# Patient Record
Sex: Female | Born: 1970
Health system: Southern US, Community
[De-identification: ages and names within clinical notes are randomized; demographics above are authoritative.]

## PROBLEM LIST (undated history)

## (undated) DIAGNOSIS — G894 Chronic pain syndrome: Secondary | ICD-10-CM

## (undated) DIAGNOSIS — R413 Other amnesia: Principal | ICD-10-CM

## (undated) DIAGNOSIS — M5136 Other intervertebral disc degeneration, lumbar region: Secondary | ICD-10-CM

## (undated) DIAGNOSIS — E559 Vitamin D deficiency, unspecified: Secondary | ICD-10-CM

## (undated) DIAGNOSIS — M1990 Unspecified osteoarthritis, unspecified site: Secondary | ICD-10-CM

## (undated) DIAGNOSIS — F329 Major depressive disorder, single episode, unspecified: Secondary | ICD-10-CM

## (undated) DIAGNOSIS — F32A Depression, unspecified: Secondary | ICD-10-CM

## (undated) DIAGNOSIS — R51 Headache: Secondary | ICD-10-CM

## (undated) DIAGNOSIS — M797 Fibromyalgia: Secondary | ICD-10-CM

## (undated) DIAGNOSIS — D219 Benign neoplasm of connective and other soft tissue, unspecified: Secondary | ICD-10-CM

## (undated) DIAGNOSIS — I10 Essential (primary) hypertension: Secondary | ICD-10-CM

## (undated) DIAGNOSIS — M489 Spondylopathy, unspecified: Secondary | ICD-10-CM

## (undated) DIAGNOSIS — E669 Obesity, unspecified: Secondary | ICD-10-CM

## (undated) DIAGNOSIS — E785 Hyperlipidemia, unspecified: Secondary | ICD-10-CM

## (undated) DIAGNOSIS — M51369 Other intervertebral disc degeneration, lumbar region without mention of lumbar back pain or lower extremity pain: Secondary | ICD-10-CM

## (undated) DIAGNOSIS — G629 Polyneuropathy, unspecified: Secondary | ICD-10-CM

## (undated) DIAGNOSIS — H3552 Pigmentary retinal dystrophy: Secondary | ICD-10-CM

## (undated) DIAGNOSIS — IMO0001 Reserved for inherently not codable concepts without codable children: Principal | ICD-10-CM

## (undated) HISTORY — DX: Reserved for inherently not codable concepts without codable children: IMO0001

## (undated) HISTORY — DX: Headache: R51

## (undated) HISTORY — DX: Chronic pain syndrome: G89.4

## (undated) HISTORY — DX: Depression, unspecified: F32.A

## (undated) HISTORY — DX: Other amnesia: R41.3

## (undated) HISTORY — DX: Hyperlipidemia, unspecified: E78.5

## (undated) HISTORY — DX: Vitamin D deficiency, unspecified: E55.9

## (undated) HISTORY — DX: Major depressive disorder, single episode, unspecified: F32.9

## (undated) HISTORY — PX: TUBAL LIGATION: SHX77

## (undated) HISTORY — DX: Obesity, unspecified: E66.9

---

## 2004-05-06 ENCOUNTER — Encounter: Admission: RE | Admit: 2004-05-06 | Discharge: 2004-05-06 | Payer: Self-pay | Admitting: *Deleted

## 2007-04-21 ENCOUNTER — Emergency Department (HOSPITAL_COMMUNITY): Admission: EM | Admit: 2007-04-21 | Discharge: 2007-04-21 | Payer: Self-pay | Admitting: Family Medicine

## 2007-04-22 ENCOUNTER — Emergency Department (HOSPITAL_COMMUNITY): Admission: EM | Admit: 2007-04-22 | Discharge: 2007-04-22 | Payer: Self-pay | Admitting: Emergency Medicine

## 2008-12-17 ENCOUNTER — Emergency Department (HOSPITAL_COMMUNITY): Admission: EM | Admit: 2008-12-17 | Discharge: 2008-12-17 | Payer: Self-pay | Admitting: Emergency Medicine

## 2009-08-12 ENCOUNTER — Emergency Department (HOSPITAL_COMMUNITY): Admission: EM | Admit: 2009-08-12 | Discharge: 2009-08-12 | Payer: Self-pay | Admitting: Emergency Medicine

## 2011-05-16 ENCOUNTER — Other Ambulatory Visit: Payer: Self-pay | Admitting: Physician Assistant

## 2011-05-16 DIAGNOSIS — Z1231 Encounter for screening mammogram for malignant neoplasm of breast: Secondary | ICD-10-CM

## 2011-06-06 ENCOUNTER — Ambulatory Visit: Payer: Self-pay

## 2011-06-11 ENCOUNTER — Ambulatory Visit: Payer: Self-pay

## 2012-10-28 DIAGNOSIS — M489 Spondylopathy, unspecified: Secondary | ICD-10-CM

## 2012-10-28 HISTORY — DX: Spondylopathy, unspecified: M48.9

## 2012-11-01 ENCOUNTER — Encounter (HOSPITAL_COMMUNITY): Payer: Self-pay | Admitting: *Deleted

## 2012-11-01 ENCOUNTER — Ambulatory Visit
Admission: RE | Admit: 2012-11-01 | Discharge: 2012-11-01 | Disposition: A | Discharge status: Home / Self Care | Payer: Medicaid Other | Source: Ambulatory Visit | Attending: Physician Assistant | Admitting: Physician Assistant

## 2012-11-01 ENCOUNTER — Emergency Department (HOSPITAL_COMMUNITY)
Admission: EM | Admit: 2012-11-01 | Discharge: 2012-11-01 | Disposition: A | Discharge status: Home / Self Care | Payer: Medicaid Other | Attending: Emergency Medicine | Admitting: Emergency Medicine

## 2012-11-01 ENCOUNTER — Other Ambulatory Visit: Payer: Self-pay | Admitting: Physician Assistant

## 2012-11-01 ENCOUNTER — Emergency Department (HOSPITAL_COMMUNITY): Payer: Medicaid Other

## 2012-11-01 DIAGNOSIS — M79609 Pain in unspecified limb: Secondary | ICD-10-CM

## 2012-11-01 DIAGNOSIS — M542 Cervicalgia: Secondary | ICD-10-CM

## 2012-11-01 DIAGNOSIS — M545 Low back pain, unspecified: Secondary | ICD-10-CM

## 2012-11-01 DIAGNOSIS — Z87891 Personal history of nicotine dependence: Secondary | ICD-10-CM | POA: Insufficient documentation

## 2012-11-01 DIAGNOSIS — Z8669 Personal history of other diseases of the nervous system and sense organs: Secondary | ICD-10-CM | POA: Insufficient documentation

## 2012-11-01 DIAGNOSIS — M1990 Unspecified osteoarthritis, unspecified site: Secondary | ICD-10-CM | POA: Insufficient documentation

## 2012-11-01 DIAGNOSIS — Z79899 Other long term (current) drug therapy: Secondary | ICD-10-CM | POA: Insufficient documentation

## 2012-11-01 DIAGNOSIS — M79604 Pain in right leg: Secondary | ICD-10-CM

## 2012-11-01 DIAGNOSIS — I10 Essential (primary) hypertension: Secondary | ICD-10-CM | POA: Insufficient documentation

## 2012-11-01 HISTORY — DX: Unspecified osteoarthritis, unspecified site: M19.90

## 2012-11-01 HISTORY — DX: Polyneuropathy, unspecified: G62.9

## 2012-11-01 HISTORY — DX: Essential (primary) hypertension: I10

## 2012-11-01 NOTE — ED Provider Notes (Signed)
Medical screening examination/treatment/procedure(s) were performed by non-physician practitioner and as supervising physician I was immediately available for consultation/collaboration. Devoria Albe, MD, Armando Gang   Ward Givens, MD 11/01/12 [phone]

## 2012-11-01 NOTE — Progress Notes (Signed)
VASCULAR LAB PRELIMINARY  PRELIMINARY  PRELIMINARY  PRELIMINARY  Right lower extremity venous duplex completed.    Preliminary report:  Right:  No evidence of DVT, superficial thrombosis, or Baker's cyst.  Donna Coleman, RVT 11/01/2012, 3:36 PM

## 2012-11-01 NOTE — ED Provider Notes (Signed)
History     CSN: [id number]  Arrival date & time 11/01/12  1144   First MD Initiated Contact with Patient 11/01/12 1333      Chief Complaint  Patient presents with  . Leg Pain    (Consider location/radiation/quality/duration/timing/severity/associated sxs/prior treatment) HPI  42 year old female with history of arthritis and neuropathy presents for evaluations of left leg pain and right leg discomfort.  Patient reports she injured her left midshin a year ago when she ran into the edge of the table and suffered a laceration to the affected area.  Since then she has been having intermittent pain to her left mid shin.  Pain has been worsening for the past several days.  Describe as sharp, stabbing, persistent.  She denies any recent injury but request to have an xray to make sure it's not broken.  No associated numbness or weakness to Coleman leg.  Pt also notice tenderness to medial aspect of R thigh for the past 3-4 days.  Sts she felt a "knot" which is tender to the touch.  Report having cramping sensation to both legs and also shooting pain in both legs.  Denies prior PE/DVT, recent surgery, prolonged bed rest, long trip, calf pain or leg swelling.  Denies recent trauma.  Pt has been taking tramadol, ibuprofen, and neurontin without relief.  Denies cp, sob, fever, chills or rash.   Past Medical History  Diagnosis Date  . Arthritis     neck, central lumbar area  . Neuropathy   . Hypertension     History reviewed. No pertinent past surgical history.  No family history on file.  History  Substance Use Topics  . Smoking status: Former Games developer  . Smokeless tobacco: Not on file  . Alcohol Use: No    OB History   Grav Para Term Preterm Abortions TAB SAB Ect Mult Living                  Review of Systems  Constitutional:       10 Systems reviewed and all are negative for acute change except as noted in the HPI.     Allergies  Review of patient's allergies indicates no known  allergies.  Home Medications   Current Outpatient Rx  Name  Route  Sig  Dispense  Refill  . cycloSPORINE (RESTASIS) 0.05 % ophthalmic emulsion   Both Eyes   Place 1 drop into both eyes 2 (two) times daily.         . DULoxetine (CYMBALTA) 30 MG capsule   Oral   Take 30 mg by mouth daily.         Marland Kitchen gabapentin (NEURONTIN) 300 MG capsule   Oral   Take 300 mg by mouth 2 (two) times daily.         Marland Kitchen ibuprofen (ADVIL,MOTRIN) 800 MG tablet   Oral   Take 800 mg by mouth every 8 (eight) hours as needed for pain.         . indomethacin (INDOCIN) 50 MG capsule   Oral   Take 50 mg by mouth 2 (two) times daily with a meal.         . lidocaine (LIDODERM) 5 %   Transdermal   Place 1-3 patches onto the skin daily. Remove & Discard patch within 12 hours or as directed by MD         . Karma Lew (ZYLET OP)   Ophthalmic   Apply 1 drop to eye 4 (four) times daily.         Marland Kitchen  traMADol (ULTRAM) 50 MG tablet   Oral   Take 50 mg by mouth every 6 (six) hours as needed for pain.           BP 149/103  Pulse 79  Temp(Src) 98.6 F (37 C) (Oral)  Resp 18  SpO2 96%  Physical Exam  Nursing note and vitals reviewed. Constitutional: She appears well-developed and well-nourished. No distress.  Awake, alert, nontoxic appearance  HENT:  Head: Atraumatic.  Eyes: Conjunctivae are normal. Right eye exhibits no discharge. Left eye exhibits no discharge.  Neck: Neck supple.  Cardiovascular: Normal rate, regular rhythm and intact distal pulses.   Pulmonary/Chest: Effort normal. No respiratory distress. She exhibits no tenderness.  Abdominal: Soft. There is no tenderness. There is no rebound.  Musculoskeletal: She exhibits tenderness (R thigh: palpable cords noted to medial thigh, ttp, no overlying skin changes or rash.  No pedal edema.  Coleman leg: tenderness to mid anterior tibia overlying old scar.  no deformity, no rash.). She exhibits no edema.  ROM appears intact, no obvious  focal weakness  Neurological: She is alert.  Mental status and motor strength appears intact  Skin: No rash noted.  Psychiatric: She has a normal mood and affect.    ED Course  Procedures (including critical care time)  2:26 PM Pt presents with complaint of "knot" to R thigh.  There is a palpable cord noted to R medial thigh.  Will obtain venous doppler to r/o DVT.  Pt also has tenderness to Coleman mid shin.  No deformity noted however pt request for xray, xray ordered.    3:57 PM Left lower leg xray shows no acute finding. Doppler study of right leg shows no evidence of DVT. Patient was reassured. I recommend patient to followup with her primary care Dr., Dr. Mayford Knife for further management. Patient able to ambulate without difficulty. She is afebrile with stable normal vital sign. Return precautions discussed.  Labs Reviewed - No data to display Dg Cervical Spine Complete  11/01/2012  *RADIOLOGY REPORT*  Clinical Data: Neck and bilateral leg pain.  CERVICAL SPINE - COMPLETE 4+ VIEW  Comparison: None  Findings: Moderate degenerative cervical spondylosis with multilevel disc disease and facet disease.  Overall alignment is maintained.  No acute bony findings or abnormal prevertebral soft tissue swelling.  The oblique films demonstrate normally aligned facets and patent neural foramen.  The C1-2 articulations are maintained.  The lung apices are clear.  IMPRESSION:  1.  Degenerative cervical spondylosis, somewhat advanced for age. 2.  Normal alignment and no acute bony findings.   Original Report Authenticated By: Rudie Meyer, M.D.    Dg Lumbar Spine Complete  11/01/2012  *RADIOLOGY REPORT*  Clinical Data: Low back and bilateral leg pain.  LUMBAR SPINE - COMPLETE 4+ VIEW  Comparison: None  Findings: Minimal degenerative changes involving the lumbar spine. Normal alignment of the lumbar vertebral bodies and disc spaces are maintained.  No acute bony findings or destructive bony changes. Aorto-iliac  calcifications are noted and are advanced for age.  IMPRESSION:  1.  Mild degenerative changes but no acute bony findings. 2.  Aorto-iliac calcifications.   Original Report Authenticated By: Rudie Meyer, M.D.      No results found for this or any previous visit. Dg Cervical Spine Complete  11/01/2012  *RADIOLOGY REPORT*  Clinical Data: Neck and bilateral leg pain.  CERVICAL SPINE - COMPLETE 4+ VIEW  Comparison: None  Findings: Moderate degenerative cervical spondylosis with multilevel disc disease and facet disease.  Overall alignment is maintained.  No acute bony findings or abnormal prevertebral soft tissue swelling.  The oblique films demonstrate normally aligned facets and patent neural foramen.  The C1-2 articulations are maintained.  The lung apices are clear.  IMPRESSION:  1.  Degenerative cervical spondylosis, somewhat advanced for age. 2.  Normal alignment and no acute bony findings.   Original Report Authenticated By: Rudie Meyer, M.D.    Dg Lumbar Spine Complete  11/01/2012  *RADIOLOGY REPORT*  Clinical Data: Low back and bilateral leg pain.  LUMBAR SPINE - COMPLETE 4+ VIEW  Comparison: None  Findings: Minimal degenerative changes involving the lumbar spine. Normal alignment of the lumbar vertebral bodies and disc spaces are maintained.  No acute bony findings or destructive bony changes. Aorto-iliac calcifications are noted and are advanced for age.  IMPRESSION:  1.  Mild degenerative changes but no acute bony findings. 2.  Aorto-iliac calcifications.   Original Report Authenticated By: Rudie Meyer, M.D.    Dg Tibia/fibula Left  11/01/2012  *RADIOLOGY REPORT*  Clinical Data: Old sore at mid shaft anterior lower leg, continued pain  LEFT TIBIA AND FIBULA - 2 VIEW  Comparison: None  Findings: Diffuse osseous demineralization. Knee and ankle joint alignments normal. No acute fracture, dislocation or bone destruction. Nonfused accessory ossification center at inferior pole of patella.   IMPRESSION: No acute osseous abnormalities.   Original Report Authenticated By: Ulyses Southward, M.D.     Donna Coleman, Donna Coleman Female Jan 19, 1971 [id number]            Progress Notes signed by Gara Kroner, RVT at 11/01/2012 3:38 PM    Author: Gara Kroner, RVT Service: Vascular Lab Author Type: Cardiovascular Sonographer   Filed: 11/01/2012 3:38 PM Note Time: 11/01/2012 3:36 PM         VASCULAR LAB  PRELIMINARY PRELIMINARY PRELIMINARY PRELIMINARY  Right lower extremity venous duplex completed.  Preliminary report: Right: No evidence of DVT, superficial thrombosis, or Baker's cyst.  CESTONE, HELENE, RVT  11/01/2012, 3:36 PM     1. Leg pain, bilateral       MDM  BP 149/103  Pulse 79  Temp(Src) 98.6 F (37 C) (Oral)  Resp 18  SpO2 96%  LMP 10/17/2012  I have reviewed nursing notes and vital signs. I personally reviewed the imaging tests through PACS system  I reviewed available ER/hospitalization records thought the EMR         Donna Helper, PA-C 11/01/12 1600

## 2012-11-01 NOTE — ED Notes (Signed)
Pt had previous left leg mid shin wound that healed about one year ago and since has been having cramping in leg and a shooting pain in the wound area.  Strong pulse

## 2012-11-24 ENCOUNTER — Encounter (HOSPITAL_COMMUNITY): Payer: Self-pay | Admitting: Emergency Medicine

## 2012-11-24 ENCOUNTER — Emergency Department (HOSPITAL_COMMUNITY)
Admission: EM | Admit: 2012-11-24 | Discharge: 2012-11-24 | Disposition: A | Discharge status: Home / Self Care | Payer: Medicaid Other | Attending: Emergency Medicine | Admitting: Emergency Medicine

## 2012-11-24 DIAGNOSIS — Z79899 Other long term (current) drug therapy: Secondary | ICD-10-CM | POA: Insufficient documentation

## 2012-11-24 DIAGNOSIS — G8929 Other chronic pain: Secondary | ICD-10-CM | POA: Insufficient documentation

## 2012-11-24 DIAGNOSIS — Z8739 Personal history of other diseases of the musculoskeletal system and connective tissue: Secondary | ICD-10-CM | POA: Insufficient documentation

## 2012-11-24 DIAGNOSIS — M545 Low back pain, unspecified: Secondary | ICD-10-CM | POA: Insufficient documentation

## 2012-11-24 DIAGNOSIS — Z87891 Personal history of nicotine dependence: Secondary | ICD-10-CM | POA: Insufficient documentation

## 2012-11-24 DIAGNOSIS — Z8669 Personal history of other diseases of the nervous system and sense organs: Secondary | ICD-10-CM | POA: Insufficient documentation

## 2012-11-24 DIAGNOSIS — I10 Essential (primary) hypertension: Secondary | ICD-10-CM | POA: Insufficient documentation

## 2012-11-24 HISTORY — DX: Spondylopathy, unspecified: M48.9

## 2012-11-24 MED ORDER — MORPHINE SULFATE 4 MG/ML IJ SOLN
6.0000 mg | Freq: Once | INTRAMUSCULAR | Status: AC
  Administered 2012-11-24: 6 mg via INTRAMUSCULAR
  Filled 2012-11-24: qty 2

## 2012-11-24 MED ORDER — CYCLOBENZAPRINE HCL 10 MG PO TABS: 10.0000 mg | ORAL_TABLET | Freq: Three times a day (TID) | ORAL | Status: DC | PRN

## 2012-11-24 MED ORDER — DEXAMETHASONE SODIUM PHOSPHATE 10 MG/ML IJ SOLN
10.0000 mg | Freq: Once | INTRAMUSCULAR | Status: AC
  Administered 2012-11-24: 10 mg via INTRAMUSCULAR
  Filled 2012-11-24: qty 1

## 2012-11-24 MED ORDER — HYDROCODONE-ACETAMINOPHEN 5-325 MG PO TABS
1.0000 | ORAL_TABLET | Freq: Four times a day (QID) | ORAL | Status: DC | PRN
Start: 2012-11-24 — End: 2013-01-08

## 2012-11-24 MED ORDER — DIAZEPAM 5 MG PO TABS
5.0000 mg | ORAL_TABLET | Freq: Once | ORAL | Status: AC
  Administered 2012-11-24: 5 mg via ORAL
  Filled 2012-11-24: qty 1

## 2012-11-24 NOTE — ED Notes (Signed)
Pt c/o tailbone pain that shoots all the way up into neck, pt reports this all started 5 years ago. This episode started today, pt reports she is having severe spasms and when she can't get the pain under control she has to get cortisone injections. Pt in nad, skin warm and dry, resp e/u.

## 2012-11-24 NOTE — ED Notes (Signed)
Per EMS - pt coming from extended stay hotel. Pt c/o lower back pain. Pt was dx with cervical back disease. Pt started to have back spasms and she would become in extreme pain. Pt has had back spasms since 2009, pt reports normally she gets a cortisone shot and feels a lot better. BP 170 palpated. HR 80 RR 18. Rating pain at 10/10.

## 2012-11-24 NOTE — ED Provider Notes (Signed)
Medical screening examination/treatment/procedure(s) were performed by non-physician practitioner and as supervising physician I was immediately available for consultation/collaboration.  Aleina Burgio T Antwyne Pingree, MD 11/24/12 2327 

## 2012-11-24 NOTE — ED Notes (Signed)
Pt placed on bedpan

## 2012-11-24 NOTE — ED Provider Notes (Signed)
History     CSN: [id number]  Arrival date & time 11/24/12  1918   First MD Initiated Contact with Patient 11/24/12 1920      Chief Complaint  Patient presents with  . Back Pain    (Consider location/radiation/quality/duration/timing/severity/associated sxs/prior treatment) HPI Patient presents to the emergency department with chronic low back pain.  Patient, states, that she's had chronic low and upper back pain for several years.  Patient, states, that she hasn't been seen by her primary care Dr. For this issue. patient denies numbness, weakness, nausea, vomiting, abdominal pain, headache, blurred vision, fever, syncope. Patient states she takes home medications for her low back pain.  Patient, states her low back pain, increased today.  There is no trauma. Past Medical History  Diagnosis Date  . Arthritis     neck, central lumbar area  . Neuropathy   . Hypertension   . Cervical spine disease 10/2012    Past Surgical History  Procedure Laterality Date  . Tubal ligation      No family history on file.  History  Substance Use Topics  . Smoking status: Former Games developer  . Smokeless tobacco: Not on file  . Alcohol Use: No    OB History   Grav Para Term Preterm Abortions TAB SAB Ect Mult Living                  Review of Systems All other systems negative except as documented in the HPI. All pertinent positives and negatives as reviewed in the HPI. Allergies  Review of patient's allergies indicates no known allergies.  Home Medications   Current Outpatient Rx  Name  Route  Sig  Dispense  Refill  . Acetaminophen (TYLENOL PO)   Oral   Take 2 tablets by mouth once as needed (fever).         . citalopram (CELEXA) 20 MG tablet   Oral   Take 20 mg by mouth daily.         . cycloSPORINE (RESTASIS) 0.05 % ophthalmic emulsion   Both Eyes   Place 1 drop into both eyes 2 (two) times daily.         . DULoxetine (CYMBALTA) 30 MG capsule   Oral   Take 30 mg by  mouth daily.         Marland Kitchen gabapentin (NEURONTIN) 300 MG capsule   Oral   Take 300 mg by mouth 2 (two) times daily.         Marland Kitchen ibuprofen (ADVIL,MOTRIN) 800 MG tablet   Oral   Take 800 mg by mouth every 8 (eight) hours as needed for pain.         . indomethacin (INDOCIN) 25 MG capsule   Oral   Take 25-50 mg by mouth 2 (two) times daily.         Marland Kitchen lidocaine (LIDODERM) 5 %   Transdermal   Place 1-3 patches onto the skin daily. Remove & Discard patch within 12 hours or as directed by MD         . lisinopril-hydrochlorothiazide (PRINZIDE,ZESTORETIC) 10-12.5 MG per tablet   Oral   Take 1 tablet by mouth daily.         Karma Lew (ZYLET OP)   Ophthalmic   Apply 1 drop to eye 4 (four) times daily.         . Menthol-Methyl Salicylate (MUSCLE RUB) 10-15 % CREA   Topical   Apply 1 application topically as needed (pain).         Marland Kitchen  traMADol (ULTRAM) 50 MG tablet   Oral   Take 50 mg by mouth every 6 (six) hours as needed for pain.           BP 160/84  Pulse 61  Temp(Src) 98.1 F (36.7 C) (Oral)  Resp 20  SpO2 100%  LMP 11/16/2012  Physical Exam  Nursing note and vitals reviewed. Constitutional: She is oriented to person, place, and time. She appears well-developed and well-nourished. No distress.  Cardiovascular: Normal rate, regular rhythm and normal heart sounds.  Exam reveals no gallop and no friction rub.   No murmur heard. Pulmonary/Chest: Effort normal and breath sounds normal.  Musculoskeletal:       Lumbar back: She exhibits tenderness, pain and spasm. She exhibits normal range of motion and no bony tenderness.       Back:  Neurological: She is alert and oriented to person, place, and time. She has normal strength. No sensory deficit. She exhibits normal muscle tone. Coordination normal. GCS eye subscore is 4. GCS verbal subscore is 5. GCS motor subscore is 6.  Reflex Scores:      Patellar reflexes are 2+ on the right side and 2+ on the  left side.      Achilles reflexes are 2+ on the right side and 2+ on the left side. Skin: Skin is warm and dry. No rash noted.    ED Course  Procedures (including critical care time)  Patient be referred back to her primary care, Dr. Geronimo Running advised return here for any worsening in her condition.  The patient does not have any neurological deficits noted on exam.  She has normal reflexes in her lower extremities.   MDM         Carlyle Dolly, PA-C 11/24/12 2110  Carlyle Dolly, PA-C 11/24/12 2111

## 2013-01-08 ENCOUNTER — Emergency Department (HOSPITAL_COMMUNITY)
Admission: EM | Admit: 2013-01-08 | Discharge: 2013-01-08 | Disposition: A | Discharge status: Home / Self Care | Payer: Medicaid Other | Attending: Emergency Medicine | Admitting: Emergency Medicine

## 2013-01-08 ENCOUNTER — Encounter (HOSPITAL_COMMUNITY): Payer: Self-pay | Admitting: Emergency Medicine

## 2013-01-08 ENCOUNTER — Emergency Department (HOSPITAL_COMMUNITY): Payer: Medicaid Other

## 2013-01-08 DIAGNOSIS — Z8742 Personal history of other diseases of the female genital tract: Secondary | ICD-10-CM | POA: Insufficient documentation

## 2013-01-08 DIAGNOSIS — R079 Chest pain, unspecified: Secondary | ICD-10-CM | POA: Insufficient documentation

## 2013-01-08 DIAGNOSIS — H3552 Pigmentary retinal dystrophy: Secondary | ICD-10-CM | POA: Insufficient documentation

## 2013-01-08 DIAGNOSIS — Z87891 Personal history of nicotine dependence: Secondary | ICD-10-CM | POA: Insufficient documentation

## 2013-01-08 DIAGNOSIS — M5137 Other intervertebral disc degeneration, lumbosacral region: Secondary | ICD-10-CM | POA: Insufficient documentation

## 2013-01-08 DIAGNOSIS — G5790 Unspecified mononeuropathy of unspecified lower limb: Secondary | ICD-10-CM | POA: Insufficient documentation

## 2013-01-08 DIAGNOSIS — M19049 Primary osteoarthritis, unspecified hand: Secondary | ICD-10-CM | POA: Insufficient documentation

## 2013-01-08 DIAGNOSIS — M549 Dorsalgia, unspecified: Secondary | ICD-10-CM

## 2013-01-08 DIAGNOSIS — M545 Low back pain, unspecified: Secondary | ICD-10-CM | POA: Insufficient documentation

## 2013-01-08 DIAGNOSIS — R42 Dizziness and giddiness: Secondary | ICD-10-CM | POA: Insufficient documentation

## 2013-01-08 DIAGNOSIS — R0789 Other chest pain: Secondary | ICD-10-CM

## 2013-01-08 DIAGNOSIS — M51379 Other intervertebral disc degeneration, lumbosacral region without mention of lumbar back pain or lower extremity pain: Secondary | ICD-10-CM | POA: Insufficient documentation

## 2013-01-08 DIAGNOSIS — M47812 Spondylosis without myelopathy or radiculopathy, cervical region: Secondary | ICD-10-CM | POA: Insufficient documentation

## 2013-01-08 DIAGNOSIS — I10 Essential (primary) hypertension: Secondary | ICD-10-CM | POA: Insufficient documentation

## 2013-01-08 DIAGNOSIS — Z79899 Other long term (current) drug therapy: Secondary | ICD-10-CM | POA: Insufficient documentation

## 2013-01-08 DIAGNOSIS — G8929 Other chronic pain: Secondary | ICD-10-CM | POA: Insufficient documentation

## 2013-01-08 DIAGNOSIS — Z8739 Personal history of other diseases of the musculoskeletal system and connective tissue: Secondary | ICD-10-CM | POA: Insufficient documentation

## 2013-01-08 DIAGNOSIS — M542 Cervicalgia: Secondary | ICD-10-CM

## 2013-01-08 DIAGNOSIS — M25579 Pain in unspecified ankle and joints of unspecified foot: Secondary | ICD-10-CM | POA: Insufficient documentation

## 2013-01-08 HISTORY — DX: Benign neoplasm of connective and other soft tissue, unspecified: D21.9

## 2013-01-08 HISTORY — DX: Pigmentary retinal dystrophy: H35.52

## 2013-01-08 LAB — CBC
HCT: 40.9 % (ref 36.0–46.0)
Hemoglobin: 14.2 g/dL (ref 12.0–15.0)
MCH: 31.8 pg (ref 26.0–34.0)
MCHC: 34.7 g/dL (ref 30.0–36.0)
MCV: 91.7 fL (ref 78.0–100.0)
Platelets: 325 10*3/uL (ref 150–400)
RBC: 4.46 MIL/uL (ref 3.87–5.11)
RDW: 14.6 % (ref 11.5–15.5)
WBC: 9.5 10*3/uL (ref 4.0–10.5)

## 2013-01-08 LAB — POCT I-STAT TROPONIN I: Troponin i, poc: 0 ng/mL (ref 0.00–0.08)

## 2013-01-08 LAB — BASIC METABOLIC PANEL
BUN: 13 mg/dL (ref 6–23)
CO2: 26 mEq/L (ref 19–32)
Calcium: 9.8 mg/dL (ref 8.4–10.5)
Chloride: 104 mEq/L (ref 96–112)
Creatinine, Ser: 0.72 mg/dL (ref 0.50–1.10)
GFR calc Af Amer: >90 mL/min (ref >90)
GFR calc non Af Amer: >90 mL/min (ref >90)
Glucose, Bld: 78 mg/dL (ref 70–99)
Potassium: 4.6 mEq/L (ref 3.5–5.1)
Sodium: 139 mEq/L (ref 135–145)

## 2013-01-08 LAB — PRO B NATRIURETIC PEPTIDE: Pro B Natriuretic peptide (BNP): 137.9 pg/mL — ABNORMAL HIGH (ref 0–125)

## 2013-01-08 MED ORDER — SODIUM CHLORIDE 0.9 % IV BOLUS (SEPSIS)
1000.0000 mL | Freq: Once | INTRAVENOUS | Status: AC
  Administered 2013-01-08: 1000 mL via INTRAVENOUS

## 2013-01-08 MED ORDER — MORPHINE SULFATE 4 MG/ML IJ SOLN
4.0000 mg | Freq: Once | INTRAMUSCULAR | Status: AC
  Administered 2013-01-08: 4 mg via INTRAVENOUS
  Filled 2013-01-08: qty 1

## 2013-01-08 MED ORDER — OXYCODONE-ACETAMINOPHEN 5-325 MG PO TABS
2.0000 | ORAL_TABLET | Freq: Once | ORAL | Status: AC
  Administered 2013-01-08: 2 via ORAL
  Filled 2013-01-08: qty 2

## 2013-01-08 MED ORDER — DEXAMETHASONE SODIUM PHOSPHATE 10 MG/ML IJ SOLN
10.0000 mg | Freq: Once | INTRAMUSCULAR | Status: AC
  Administered 2013-01-08: 10 mg via INTRAMUSCULAR
  Filled 2013-01-08: qty 1

## 2013-01-08 MED ORDER — ACETAMINOPHEN-CODEINE #3 300-30 MG PO TABS: 1.0000 | ORAL_TABLET | Freq: Four times a day (QID) | ORAL | Status: DC | PRN

## 2013-01-08 MED ORDER — CYCLOBENZAPRINE HCL 10 MG PO TABS: 10.0000 mg | ORAL_TABLET | Freq: Two times a day (BID) | ORAL | Status: DC | PRN

## 2013-01-08 NOTE — ED Notes (Signed)
Patients ekg was not done in triage.

## 2013-01-08 NOTE — ED Provider Notes (Signed)
Medical screening examination/treatment/procedure(s) were performed by non-physician practitioner and as supervising physician I was immediately available for consultation/collaboration.   Vian Fluegel, MD 01/08/13 1559 

## 2013-01-08 NOTE — ED Notes (Addendum)
Pt reports neck pain, back pain, right foot pain ongoing for months. Pt reports difficulty walking due to pain. Pt also c/o dizziness and seeing floaters for past 2 days. Pt cannot afford her medications.

## 2013-01-08 NOTE — Progress Notes (Signed)
Case manager met with Patient at bedside.Case manager introduced self,explained role and provided support and resources. Patient does not have a PCP. [employer] for the   [employer] provided.Patient educated about social services resources -contact number provided.Patient reports she uses the  Food stamps program. Patient has MEDICAID,    And does not qualify for the medication program.Educated patient about resources.Patient receptive to this and verbalized her understanding of case manager education.

## 2013-01-08 NOTE — ED Provider Notes (Signed)
History    CSN: [id number] Arrival date & time 01/08/13  0844  First MD Initiated Contact with Patient 01/08/13 [id number]     Chief Complaint  Patient presents with  . Neck Pain  . Back Pain  . Foot Pain    Right foot  . Chest Pain  . Dizziness   (Consider location/radiation/quality/duration/timing/severity/associated sxs/prior Treatment) HPI Comments: 42 y.o. Female with PMHx of degenerative disc dz of lumbar spine, arthritis of bilateral hands, lower extrremity neuropathy, and degenerative cervical spondylosis with multilevel disc disease and facet disease presents today with multiple chronic complaints, each going back many months to years.   Chest pain: onset on and off for a year. Central chest pain. Does not radiate. Described as squeezing. Pain is moderate. No exertional component. Nothing makes it better or worse. No interventions. Admits dizziness. Denies fever, nausea, vomiting, diaphoresis, jaw pain.   Back pain/neck pain, right sided neuropathy: onset many years. Pt was not happy with treatment of prior PCP ("he was trying to kill me") so is recently under the care of a new PCP trying to work out a care plan for this chronic condition. No new injury. Similar to previous pain. Sharp. Severe. Neck pain is localized to cervical spine. Back pain radiated down back of right leg to right foot. Was relieved with Tylenol 3 and Flexeril, but she is out of that medication. Denies bowel/bladder pain, saddle anesthesia, night sweats.   Patient is a 42 y.o. female presenting with neck pain, back pain, lower extremity pain, and chest pain.  Neck Pain Associated symptoms: chest pain   Associated symptoms: no fever, no headaches, no numbness and no weakness   Back Pain Associated symptoms: chest pain   Associated symptoms: no abdominal pain, no dysuria, no fever, no headaches, no numbness, no pelvic pain and no weakness   Foot Pain Associated symptoms include chest pain and neck pain.  Pertinent negatives include no abdominal pain, diaphoresis, fever, headaches, nausea, numbness, rash, vomiting or weakness.  Chest Pain Associated symptoms: back pain   Associated symptoms: no abdominal pain, no diaphoresis, no dizziness, no fever, no headache, no nausea, no numbness, no palpitations, no shortness of breath, not vomiting and no weakness    Past Medical History  Diagnosis Date  . Arthritis     neck, central lumbar area  . Neuropathy   . Hypertension   . Cervical spine disease 10/2012  . RP (retinitis pigmentosa)   . Fibroid tumor in breast   Past Surgical History  Procedure Laterality Date  . Tubal ligation     No family history on file. History  Substance Use Topics  . Smoking status: Former Games developer  . Smokeless tobacco: Not on file  . Alcohol Use: No   OB History   Grav Para Term Preterm Abortions TAB SAB Ect Mult Living                 Review of Systems  Constitutional: Negative for fever and diaphoresis.  HENT: Positive for neck pain. Negative for neck stiffness.        Chronic cervical spine pain  Eyes: Negative for visual disturbance.  Respiratory: Negative for apnea, chest tightness and shortness of breath.   Cardiovascular: Positive for chest pain. Negative for palpitations.       Central  Gastrointestinal: Negative for nausea, vomiting, abdominal pain, diarrhea and constipation.  Genitourinary: Negative for dysuria, hematuria and pelvic pain.  Musculoskeletal: Positive for back pain. Negative for gait problem.  Chronic lumbar pain  Skin: Negative for rash.  Neurological: Negative for dizziness, weakness, light-headedness, numbness and headaches.    Allergies  Review of patient's allergies indicates no known allergies.  Home Medications   Current Outpatient Rx  Name  Route  Sig  Dispense  Refill  . cycloSPORINE (RESTASIS) 0.05 % ophthalmic emulsion   Both Eyes   Place 1 drop into both eyes 2 (two) times daily.         . DULoxetine  (CYMBALTA) 30 MG capsule   Oral   Take 30 mg by mouth daily.         Marland Kitchen gabapentin (NEURONTIN) 300 MG capsule   Oral   Take 300 mg by mouth 2 (two) times daily.         . indomethacin (INDOCIN) 25 MG capsule   Oral   Take 25-50 mg by mouth 2 (two) times daily.         Marland Kitchen lidocaine (LIDODERM) 5 %   Transdermal   Place 1-3 patches onto the skin daily. Remove & Discard patch within 12 hours or as directed by MD         . lisinopril-hydrochlorothiazide (PRINZIDE,ZESTORETIC) 10-12.5 MG per tablet   Oral   Take 1 tablet by mouth daily.         Karma Lew (ZYLET OP)   Ophthalmic   Apply 1 drop to eye 4 (four) times daily.         . traMADol (ULTRAM) 50 MG tablet   Oral   Take 50 mg by mouth every 6 (six) hours as needed for pain.         . cyclobenzaprine (FLEXERIL) 10 MG tablet   Oral   Take 1 tablet (10 mg total) by mouth 3 (three) times daily as needed for muscle spasms.   15 tablet   0    BP 166/102  Pulse 71  Temp(Src) 98.3 F (36.8 C) (Oral)  Resp 16  Ht 5\' 2"  (1.575 m)  Wt 206 lb (93.441 kg)  BMI 37.67 kg/m2  SpO2 100%  LMP 12/25/2012 Physical Exam  Nursing note and vitals reviewed. Constitutional: She is oriented to person, place, and time. She appears well-developed and well-nourished. No distress.  HENT:  Head: Normocephalic and atraumatic.  Eyes: Conjunctivae and EOM are normal.  Neck: Normal range of motion. Neck supple.  No meningeal signs  Cardiovascular: Normal rate, regular rhythm and normal heart sounds.  Exam reveals no gallop and no friction rub.   No murmur heard. Pulmonary/Chest: Effort normal and breath sounds normal. No respiratory distress. She has no wheezes. She has no rales. She exhibits no tenderness.  Abdominal: Soft. Bowel sounds are normal. She exhibits no distension. There is no tenderness. There is no rebound and no guarding.  Musculoskeletal: Normal range of motion. She exhibits no edema and no tenderness.   FROM to upper and lower extremities No step-offs noted on C-spine No tenderness to palpation of the spinous processes of the C-spine, T-spine or L-spine Full range of motion of C-spine, T-spine or L-spine Mild tenderness to palpation of the cervical and lumbar spine paraspinous muscles   Neurological: She is alert and oriented to person, place, and time. No cranial nerve deficit.  Speech is clear and goal oriented, follows commands Sensation normal to light touch and two point discrimination Moves extremities without ataxia, coordination intact Normal gait and balance Normal strength in upper and lower extremities bilaterally including dorsiflexion and plantar flexion, strong and equal grip  strength   Skin: Skin is warm and dry. She is not diaphoretic. No erythema.  Psychiatric: She has a normal mood and affect.    ED Course  Procedures (including critical care time) Labs Reviewed  PRO B NATRIURETIC PEPTIDE - Abnormal; Notable for the following:    Pro B Natriuretic peptide (BNP) 137.9 (*)    All other components within normal limits  CBC  BASIC METABOLIC PANEL  POCT I-STAT TROPONIN I   Dg Chest 2 View  01/08/2013   *RADIOLOGY REPORT*  Clinical Data:  Chest pain, back pain and neck pain.  CHEST - 2 VIEW  Comparison: None  Findings: The heart size and mediastinal contours are within normal limits.  Both lungs are clear.  The visualized skeletal structures are unremarkable.  IMPRESSION: No active disease.   Original Report Authenticated By: Irish Lack, M.D.   1. Chronic back pain greater than 3 months duration   2. Cervicalgia   3. Dizziness   4. Chest pain, non-cardiac     MDM  No neurological deficits and normal neuro exam.  Patient can walk but states is painful.  No loss of bowel or bladder control.  No concern for cauda equina.  No fever, night sweats, weight loss, h/o cancer, IVDU.  Review of records and discussion with pt indicates that this is an exacerbation of a  chronic problem for the pt involving her lumbar and cervical spine. No new injury, no red flags. Relieved with pain management in the ED. Pt was given percocet in the ED, but then requested steriod and morphine injection she had received last time. Pt does not have a significant hx of treating her chronic pain in the ED, so accomodated this request. At re-evaluation, pt did state that that she experienced relief. Will send home with pain meds and muscle relaxer (Tylenol 3 at pt requeset).  Spent a good deal of time with the pt explaining that chronic pain management needs to be managed in an outpt setting and while she had been here only one other time for her pain management, I could accomodate her this time, but that she must be diligent in seeking an outpt program for her pain. Included the hospital chronic pain management policy with her discharge papers. Pt was appreciative and understood.  Regaarding pt complaint of CP and dizziness. No hx of coronary dz. Neuro exam is normal, lungs CTA, equal full expansion.  Suspicion for ACS or asthma attack is low. Not concerning for pneumothorax, pnuemonia, aortic dissection, PE (Pt denies a history of travel, immobilization, surgery, fevers, cancer, oral contraceptives or hormone use, swelling of the legs. The patient has no history of venous thromboembolis).   EKG without acute abnormalities, negative troponin, and negative CXR. Labs unconcerning. Discussed with pt that presentation of symptoms, tests and imaging performed today are reassuring to rule out acute coronary syndrome, pneumothorax, aortic dissection, pneumonia, or pulmonary embolism. Pt has been advised to return to the ED is CP becomes exertional, associated with diaphoresis or nausea, radiates to left jaw/arm, worsens or becomes concerning in any way. Pt appears reliable for follow up and is agreeable to discharge.   Case has been discussed with Dr. Fredderick Phenix who agrees with the above plan to  discharge.      Glade Nurse, PA-C 01/08/13 1241

## 2013-01-08 NOTE — ED Notes (Signed)
Patient transported to X-ray 

## 2013-01-08 NOTE — ED Notes (Signed)
Urine sample colleted if needed.

## 2013-02-23 ENCOUNTER — Other Ambulatory Visit: Payer: Self-pay

## 2013-02-23 DIAGNOSIS — Z1231 Encounter for screening mammogram for malignant neoplasm of breast: Secondary | ICD-10-CM

## 2013-03-01 ENCOUNTER — Encounter: Payer: Self-pay | Admitting: Advanced Practice Midwife

## 2013-03-15 ENCOUNTER — Ambulatory Visit
Admission: RE | Admit: 2013-03-15 | Discharge: 2013-03-15 | Disposition: A | Discharge status: Home / Self Care | Payer: Medicaid Other | Source: Ambulatory Visit

## 2013-03-15 ENCOUNTER — Ambulatory Visit: Payer: Medicaid Other

## 2013-03-15 DIAGNOSIS — Z1231 Encounter for screening mammogram for malignant neoplasm of breast: Secondary | ICD-10-CM

## 2013-03-25 ENCOUNTER — Ambulatory Visit: Payer: Self-pay | Admitting: Advanced Practice Midwife

## 2013-03-25 ENCOUNTER — Institutional Professional Consult (permissible substitution): Payer: Medicaid Other | Admitting: Pulmonary Disease

## 2013-04-04 ENCOUNTER — Encounter: Payer: Self-pay | Admitting: Pulmonary Disease

## 2013-04-04 ENCOUNTER — Ambulatory Visit (INDEPENDENT_AMBULATORY_CARE_PROVIDER_SITE_OTHER): Payer: Medicaid Other | Admitting: Pulmonary Disease

## 2013-04-04 VITALS — BP 162/100 | HR 75 | Temp 97.9°F | Ht 62.5 in | Wt 215.0 lb

## 2013-04-04 DIAGNOSIS — G4733 Obstructive sleep apnea (adult) (pediatric): Secondary | ICD-10-CM

## 2013-04-04 NOTE — Progress Notes (Signed)
Subjective:    Patient ID: Donna Coleman, female    DOB: 08-Nov-1970, 42 y.o.   MRN: [id number]  HPI The patient is a 42 year old female who been asked to see for possible obstructive sleep apnea.  She has been noted to have loud snoring, as well as an abnormal breathing pattern during sleep.  She has frequent awakenings at night, and she is not rested in the mornings upon arising.  She admits to having significant daytime sleepiness with inactivity, and will fall asleep in the evenings watching television or movies.  She does not drive currently because of her eye disease.  The patient states that she has lost 30 pounds over the last 2 years, and her Epworth score today is 15.   Sleep Questionnaire What time do you typically go to bed?( Between what hours) 10-11p 10-11p at 1517 on 04/04/13 by Maisie Fus, CMA How long does it take you to fall asleep?  at 1517 on 04/04/13 by Maisie Fus, CMA How many times during the night do you wake up? 4 4 at 1517 on 04/04/13 by Maisie Fus, CMA What time do you get out of bed to start your day? No Value [id number] at 1517 on 04/04/13 by Maisie Fus, CMA Do you drive or operate heavy machinery in your occupation? No No at 1517 on 04/04/13 by Maisie Fus, CMA How much has your weight changed (up or down) over the past two years? (In pounds) 30 lb (13.608 kg)30 lb (13.608 kg) decreased at 1517 on 04/04/13 by Maisie Fus, CMA Have you ever had a sleep study before? No No at 1517 on 04/04/13 by Maisie Fus, CMA Do you currently use CPAP? No No at 1517 on 04/04/13 by Maisie Fus, CMA Do you wear oxygen at any time? No No at 1517 on 04/04/13 by Maisie Fus, CMA   Review of Systems  Constitutional: Negative for fever and unexpected weight change.  HENT: Negative for ear pain, nosebleeds, congestion, sore throat, rhinorrhea, sneezing, trouble swallowing, dental problem, postnasal drip and sinus pressure.    Eyes: Negative for redness and itching.  Respiratory: Positive for shortness of breath. Negative for cough, chest tightness and wheezing.   Cardiovascular: Positive for chest pain and palpitations ( irregular heartbeats). Negative for leg swelling.  Gastrointestinal: Negative for nausea and vomiting.  Genitourinary: Negative for dysuria.  Musculoskeletal: Positive for joint swelling and arthralgias.  Skin: Negative for rash.  Neurological: Positive for headaches.  Hematological: Does not bruise/bleed easily.  Psychiatric/Behavioral: Positive for dysphoric mood. The patient is nervous/anxious.        Objective:   Physical Exam Constitutional:  Obese female, no acute distress  HENT:  Nares patent without discharge  Oropharynx without exudate, palate and uvula are significantly elongated.   Eyes:  Perrla, eomi, no scleral icterus  Neck:  No JVD, no TMG  Cardiovascular:  Normal rate, regular rhythm, no rubs or gallops.  2/6 sem        Intact distal pulses  Pulmonary :  Normal breath sounds, no stridor or respiratory distress   No rales, rhonchi, or wheezing  Abdominal:  Soft, nondistended, bowel sounds present.  No tenderness noted.   Musculoskeletal:  No lower extremity edema noted.  Lymph Nodes:  No cervical lymphadenopathy noted  Skin:  No cyanosis noted  Neurologic:  Alert, appropriate, moves all 4 extremities without obvious deficit.         Assessment & Plan:

## 2013-04-04 NOTE — Assessment & Plan Note (Addendum)
The patient's history is very suggestive of clinically significant sleep apnea.  I had a long discussion with her about the pathophysiology of sleep apnea, including its impact to her cardiovascular health and quality of life.  I have recommended a sleep study at the sleep center.  She is not a good candidate for a home study given her frequent awakenings, and inability for [place] to know her actual sleep time with an unattended study.   At this point, the patient does not want to have a sleep study, and believes that all of her sleeping issues are related to stress.  I have asked her to reconsider, and to give me a call if she decides to have the study done.  In the meantime, I've asked her to work aggressively on weight loss.

## 2013-04-04 NOTE — Patient Instructions (Addendum)
Please call if you would like to have sleep study for diagnosis Work on weight loss.

## 2013-04-05 ENCOUNTER — Encounter: Payer: Self-pay | Admitting: Neurology

## 2013-04-07 ENCOUNTER — Encounter: Payer: Self-pay | Admitting: Neurology

## 2013-04-07 ENCOUNTER — Ambulatory Visit (INDEPENDENT_AMBULATORY_CARE_PROVIDER_SITE_OTHER): Payer: Medicaid Other | Admitting: Neurology

## 2013-04-07 VITALS — BP 137/87 | HR 96 | Temp 98.0°F | Ht 62.5 in | Wt 213.0 lb

## 2013-04-07 DIAGNOSIS — IMO0001 Reserved for inherently not codable concepts without codable children: Secondary | ICD-10-CM

## 2013-04-07 HISTORY — DX: Reserved for inherently not codable concepts without codable children: IMO0001

## 2013-04-07 NOTE — Progress Notes (Signed)
Reason for visit: Paresthesias  Donna Coleman is a 42 y.o. female  History of present illness:  Ms. Donna Coleman is a 42 year old right-handed black female with a history of retinitis pigmentosa. The patient indicates that she was diagnosed with arthritis and a peripheral neuropathy in 2010. The patient began having numbness and tingling sensations in the feet and ankles, and over time, these symptoms have gradually progressed up the legs to the knees. The patient has developed a diffuse total body pain, and she was told that she had fibromyalgia. The patient indicates that she has back pain and neck pain, and x-rays done previously had shown some degenerative changes in the cervical and lumbosacral spine. The patient has a hot sensation across the shoulders, and pain down the arms. The patient indicates that she feels weak in the extremities. The patient has had some problems with urinary incontinence, but she indicates that the bowels are working well. The patient does report some problems with balance, without recent falls. The patient has headaches in the front and back of the head that are daily in nature, and the patient indicates that she has not had a day without headache for 2 years. The patient has pain around the eyes as well. The patient may have spots in front eyes, and dizziness with the headache. No nausea or vomiting is noted. The headaches are associated with a pressure and a stabbing pain. The patient is on a multitude of medications for pain including Tylenol #3, Fioricet, Nucynta, and Ultram. The patient also has Flexeril, Cymbalta, and Indocin that she takes on a regular basis. The patient is sent to this office for further evaluation.  Past Medical History  Diagnosis Date  . Arthritis     neck, central lumbar area  . Neuropathy   . Hypertension   . Cervical spine disease 10/2012  . RP (retinitis pigmentosa)   . Fibroid tumor in breast  . Chronic pain syndrome   . Depression    . Vitamin D deficiency   . Myalgia and myositis, unspecified 04/07/2013  . Obesity   . Headache(784.0)   . Dyslipidemia     Past Surgical History  Procedure Laterality Date  . Tubal ligation      Family History  Problem Relation Age of Onset  . Diabetes Maternal Grandmother   . Alzheimer's disease Maternal Grandmother     Social history:  reports that she quit smoking about 2 years ago. Her smoking use included Cigarettes. She has a 3.96 pack-year smoking history. She has never used smokeless tobacco. She reports that she does not drink alcohol or use illicit drugs.  Medications:  Current Outpatient Prescriptions on File Prior to Visit  Medication Sig Dispense Refill  . acetaminophen-codeine (TYLENOL #3) 300-30 MG per tablet Take 1 tablet by mouth every 6 (six) hours as needed for pain.  10 tablet  0  . buPROPion (WELLBUTRIN XL) 300 MG 24 hr tablet Take 300 mg by mouth daily.      . butalbital-acetaminophen-caffeine (FIORICET) 50-325-40 MG per tablet Take 1 tablet by mouth 4 (four) times daily as needed for headache.      . Cholecalciferol (RA VITAMIN D-3) 1000 UNITS tablet Take 1,000 Units by mouth daily.      . citalopram (CELEXA) 20 MG tablet Take 20 mg by mouth daily.      . cyclobenzaprine (FLEXERIL) 10 MG tablet Take 1 tablet (10 mg total) by mouth 3 (three) times daily as needed for muscle spasms.  15 tablet  0  . cycloSPORINE (RESTASIS) 0.05 % ophthalmic emulsion Place 1 drop into both eyes 2 (two) times daily.      . DULoxetine (CYMBALTA) 30 MG capsule Take 30 mg by mouth daily.      . indomethacin (INDOCIN) 25 MG capsule Take 25-50 mg by mouth 2 (two) times daily.      Marland Kitchen lidocaine (LIDODERM) 5 % Place 1-3 patches onto the skin daily. Remove & Discard patch within 12 hours or as directed by MD      . lisinopril-hydrochlorothiazide (PRINZIDE,ZESTORETIC) 10-12.5 MG per tablet Take 1 tablet by mouth daily.      . metoprolol tartrate (LOPRESSOR) 25 MG tablet Take 25 mg by  mouth daily.      . pravastatin (PRAVACHOL) 20 MG tablet Take 20 mg by mouth at bedtime.      . tapentadol (NUCYNTA) 50 MG TABS tablet Take 50 mg by mouth 3 (three) times daily.      . traMADol (ULTRAM) 50 MG tablet Take 50 mg by mouth every 6 (six) hours as needed for pain.       No current facility-administered medications on file prior to visit.     No Known Allergies  ROS:  Out of a complete 14 system review of symptoms, the patient complains only of the following symptoms, and all other reviewed systems are negative.  Fevers, chills, weight loss, fatigue Chest pain, heart murmur Dizziness Blurred vision, loss of vision, eye pain Shortness of breath, snoring Easy bruising Feeling hot, cold Joint pain, joint swelling, muscle cramps, achy muscles Skin sensitivity Memory loss, confusion, headache, numbness, weakness, slurred speech, tremor Depression, anxiety, insomnia, disinterest in activities, racing thoughts, snoring, restless legs  Blood pressure 137/87, pulse 96, temperature 98 F (36.7 C), temperature source Oral, height 5' 2.5" (1.588 m), weight 213 lb (96.616 kg).  Physical Exam  General: The patient is alert and cooperative at the time of the examination. The patient is moderately to markedly obese.  Head: Pupils are equal, round, and reactive to light. Discs are flat bilaterally.  Neck: The neck is supple, no carotid bruits are noted.  Respiratory: The respiratory examination is clear.  Cardiovascular: The cardiovascular examination reveals a regular rate and rhythm, no obvious murmurs or rubs are noted.  Skin: Extremities are without significant edema.  Neurologic Exam  Mental status:  Cranial nerves: Facial symmetry is present. There is good sensation of the face to pinprick and soft touch bilaterally. The strength of the facial muscles and the muscles to head turning and shoulder shrug are normal bilaterally. Speech is well enunciated, no aphasia or  dysarthria is noted. Extraocular movements are full. Visual fields are full.  Motor: The motor testing reveals 5 over 5 strength of all 4 extremities, but the patient has a lot of giveaway type weakness with prominent pain displays with all motor effort. Good symmetric motor tone is noted throughout.  Sensory: Sensory testing is intact to pinprick, soft touch, vibration sensation, and position sense on all 4 extremities. No clear stocking pattern pinprick sensory deficit is noted. No evidence of extinction is noted.  Coordination: Cerebellar testing reveals good finger-nose-finger and heel-to-shin bilaterally.  Gait and station: Gait is normal, but the patient is partially flexed with ambulation. Tandem gait is unsteady, but the patient is trying to walk on the side of her feet. Romberg is positive, but the patient tries to lean backwards even before the examiner has let go of her arms. The patient, the  patient was able to stand with her eyes closed independently. No drift is seen.  Reflexes: Deep tendon reflexes are symmetric, but are slightly depressed bilaterally. Toes are downgoing bilaterally.   Assessment/Plan:  1. Paresthesias, all 4 extremities  2. Diffuse neuromuscular pain, fibromyalgia  3. Retinitis pigmentosa  4. Chronic daily headache  The patient has a history of ongoing diffuse neuromuscular pain, headache, and paresthesias. The patient likely has fibromyalgia, but she indicates that she was told she had a peripheral neuropathy in the past. The patient will undergo nerve conduction studies of all 4 extremities, and EMG evaluation of one arm and one leg. If a peripheral neuropathy is present, further blood work will be done. The patient has very prominent  pain displays on clinical examination, and a diffusely positive review of systems. Some features of the clinical examination are nonorganic. The patient followup for the EMG evaluation.  Marlan Palau MD 04/07/2013 5:06  PM  [facility] [address] [place], [place] [address]  Phone [phone] Fax [phone]

## 2013-04-08 ENCOUNTER — Encounter (HOSPITAL_COMMUNITY): Payer: Self-pay | Admitting: Emergency Medicine

## 2013-04-08 ENCOUNTER — Emergency Department (HOSPITAL_COMMUNITY)
Admission: EM | Admit: 2013-04-08 | Discharge: 2013-04-08 | Discharge status: Against Medical Advice | Payer: Medicaid Other | Attending: Emergency Medicine | Admitting: Emergency Medicine

## 2013-04-08 DIAGNOSIS — M542 Cervicalgia: Secondary | ICD-10-CM | POA: Insufficient documentation

## 2013-04-08 DIAGNOSIS — I10 Essential (primary) hypertension: Secondary | ICD-10-CM | POA: Insufficient documentation

## 2013-04-08 DIAGNOSIS — Z87891 Personal history of nicotine dependence: Secondary | ICD-10-CM | POA: Insufficient documentation

## 2013-04-08 DIAGNOSIS — M5380 Other specified dorsopathies, site unspecified: Secondary | ICD-10-CM | POA: Insufficient documentation

## 2013-04-08 MED ORDER — OXYCODONE-ACETAMINOPHEN 5-325 MG PO TABS
1.0000 | ORAL_TABLET | Freq: Once | ORAL | Status: AC
  Administered 2013-04-08: 1 via ORAL
  Filled 2013-04-08: qty 1

## 2013-04-08 NOTE — ED Notes (Signed)
PT presents with neck/back spasms. Hx of same. Current episode lasting past 2 days. Daily & PRN meds taken. Endorses meds not working. Recent Neurologist referral.

## 2013-04-10 ENCOUNTER — Encounter (HOSPITAL_COMMUNITY): Payer: Self-pay | Admitting: Emergency Medicine

## 2013-04-10 ENCOUNTER — Emergency Department (HOSPITAL_COMMUNITY)
Admission: EM | Admit: 2013-04-10 | Discharge: 2013-04-10 | Disposition: A | Discharge status: Home / Self Care | Payer: Medicaid Other | Attending: Emergency Medicine | Admitting: Emergency Medicine

## 2013-04-10 DIAGNOSIS — F329 Major depressive disorder, single episode, unspecified: Secondary | ICD-10-CM | POA: Insufficient documentation

## 2013-04-10 DIAGNOSIS — Z8669 Personal history of other diseases of the nervous system and sense organs: Secondary | ICD-10-CM | POA: Insufficient documentation

## 2013-04-10 DIAGNOSIS — Z8739 Personal history of other diseases of the musculoskeletal system and connective tissue: Secondary | ICD-10-CM | POA: Insufficient documentation

## 2013-04-10 DIAGNOSIS — E669 Obesity, unspecified: Secondary | ICD-10-CM | POA: Insufficient documentation

## 2013-04-10 DIAGNOSIS — Z79899 Other long term (current) drug therapy: Secondary | ICD-10-CM | POA: Insufficient documentation

## 2013-04-10 DIAGNOSIS — F3289 Other specified depressive episodes: Secondary | ICD-10-CM | POA: Insufficient documentation

## 2013-04-10 DIAGNOSIS — E785 Hyperlipidemia, unspecified: Secondary | ICD-10-CM | POA: Insufficient documentation

## 2013-04-10 DIAGNOSIS — M542 Cervicalgia: Secondary | ICD-10-CM | POA: Insufficient documentation

## 2013-04-10 DIAGNOSIS — G8929 Other chronic pain: Secondary | ICD-10-CM | POA: Insufficient documentation

## 2013-04-10 DIAGNOSIS — I10 Essential (primary) hypertension: Secondary | ICD-10-CM | POA: Insufficient documentation

## 2013-04-10 DIAGNOSIS — Z87891 Personal history of nicotine dependence: Secondary | ICD-10-CM | POA: Insufficient documentation

## 2013-04-10 MED ORDER — MORPHINE SULFATE 4 MG/ML IJ SOLN
4.0000 mg | Freq: Once | INTRAMUSCULAR | Status: AC
  Administered 2013-04-10: 4 mg via INTRAMUSCULAR
  Filled 2013-04-10: qty 1

## 2013-04-10 MED ORDER — CYCLOBENZAPRINE HCL 10 MG PO TABS: 5.0000 mg | ORAL_TABLET | Freq: Two times a day (BID) | ORAL | Status: DC | PRN

## 2013-04-10 MED ORDER — OXYCODONE-ACETAMINOPHEN 5-325 MG PO TABS: 1.0000 | ORAL_TABLET | Freq: Four times a day (QID) | ORAL | Status: DC | PRN

## 2013-04-10 MED ORDER — METHYLPREDNISOLONE SODIUM SUCC 125 MG IJ SOLR
125.0000 mg | Freq: Once | INTRAMUSCULAR | Status: AC
  Administered 2013-04-10: 125 mg via INTRAMUSCULAR
  Filled 2013-04-10: qty 2

## 2013-04-10 MED ORDER — CYCLOBENZAPRINE HCL 10 MG PO TABS
5.0000 mg | ORAL_TABLET | Freq: Once | ORAL | Status: AC
  Administered 2013-04-10: 5 mg via ORAL
  Filled 2013-04-10: qty 1

## 2013-04-10 NOTE — ED Notes (Signed)
Charge checked on pt after fall. PA at bedside. Pt reported she was "feverish all over her body". Charge checked pts temp, 98.4 F oral. Pt told charge to feel her arms bc pt was "hot all over and feverish every day throughout her body". Charge asked pt if she was going through menopause, pt reported her doctors had said she might be and that she had had a period for 6 weeks straight. Charge encouraged pt to follow up with her OB GYN.

## 2013-04-10 NOTE — ED Notes (Signed)
After waling to the restroom, I assisted patient back in to the bed and she stated to me that her left leg was starting to cramp and she was going to fall again. I stated to the patient that she was not since I was there and I put her in the bed and pulled up both side rails.

## 2013-04-10 NOTE — ED Notes (Addendum)
Pt reports chronic neck and back pain. Hx of cervical spine disease and fibromyalgia. Went to [facility] on Friday, waited 4 hours was not seen and then left. Pt reports pain at present 10/10. Also reports hx of muscle spasms, reports spasms have gotten worse. Reports hx of some urinary incontinence.

## 2013-04-10 NOTE — ED Provider Notes (Signed)
CSN: [id number]     Arrival date & time 04/10/13  1478 History   First MD Initiated Contact with Patient 04/10/13 1020     Chief Complaint  Patient presents with  . Back Pain  . muscle spasms    (Consider location/radiation/quality/duration/timing/severity/associated sxs/prior Treatment) HPI  Patient presents to the ER with complaints of exacerbation of her chronic neck pain. The patient has multiple medical conditions including fibromyalgia, neuropathy, retinitis pigmentosa, depression. She states that for years she has been getting attacks of pain in her neck but usually happen at night. He does not know what causes these attacks and nothing makes it better a lot she comes to the hospital for treatment. She said normally a steroid shot and pain medication makes the pain go away. She is being treated by a specialist that she will be seen this Thursday for followup. She denies having headache, nausea, vomiting, diarrhea, fevers. She says that this pain exacerbation is the same as her usual exacerbation.  Past Medical History  Diagnosis Date  . Arthritis     neck, central lumbar area  . Neuropathy   . Hypertension   . Cervical spine disease 10/2012  . RP (retinitis pigmentosa)   . Fibroid tumor in breast  . Chronic pain syndrome   . Depression   . Vitamin D deficiency   . Myalgia and myositis, unspecified 04/07/2013  . Obesity   . Headache(784.0)   . Dyslipidemia    Past Surgical History  Procedure Laterality Date  . Tubal ligation     Family History  Problem Relation Age of Onset  . Diabetes Maternal Grandmother   . Alzheimer's disease Maternal Grandmother    History  Substance Use Topics  . Smoking status: Former Smoker -- 0.33 packs/day for 12 years    Types: Cigarettes    Quit date: 06/30/2010  . Smokeless tobacco: Never Used     Comment: 1pack per 3 days.   . Alcohol Use: No   OB History   Grav Para Term Preterm Abortions TAB SAB Ect Mult Living                  Review of Systems ROS: No TIA's or unusual headaches, no dysphagia.  No prolonged cough. No dyspnea or chest pain on exertion.  No abdominal pain, change in bowel habits, black or bloody stools.  No urinary tract symptoms.  No new or unusual musculoskeletal symptoms.  Normal menses, no abnormal vaginal bleeding, discharge or unexpected pelvic pain. No new breast lumps, breast pain or nipple discharge.  Allergies  Review of patient's allergies indicates no known allergies.  Home Medications   Current Outpatient Rx  Name  Route  Sig  Dispense  Refill  . buPROPion (WELLBUTRIN XL) 300 MG 24 hr tablet   Oral   Take 300 mg by mouth daily.         . butalbital-acetaminophen-caffeine (FIORICET) 50-325-40 MG per tablet   Oral   Take 1 tablet by mouth 4 (four) times daily as needed for headache.         . Cholecalciferol (RA VITAMIN D-3) 1000 UNITS tablet   Oral   Take 1,000 Units by mouth daily.         . citalopram (CELEXA) 20 MG tablet   Oral   Take 20 mg by mouth daily.         . cyclobenzaprine (FLEXERIL) 10 MG tablet   Oral   Take 1 tablet (10 mg total) by mouth  3 (three) times daily as needed for muscle spasms.   15 tablet   0   . cycloSPORINE (RESTASIS) 0.05 % ophthalmic emulsion   Both Eyes   Place 1 drop into both eyes 2 (two) times daily.         . DULoxetine (CYMBALTA) 30 MG capsule   Oral   Take 30 mg by mouth daily.         . fentaNYL (DURAGESIC - DOSED MCG/HR) 12 MCG/HR   Transdermal   Place 1 patch onto the skin every 3 (three) days.         Marland Kitchen gabapentin (NEURONTIN) 300 MG capsule   Oral   Take 300 mg by mouth 2 (two) times daily.         . indomethacin (INDOCIN) 25 MG capsule   Oral   Take 25-50 mg by mouth 2 (two) times daily.         Marland Kitchen lisinopril-hydrochlorothiazide (PRINZIDE,ZESTORETIC) 10-12.5 MG per tablet   Oral   Take 1 tablet by mouth daily.         Marland Kitchen loteprednol (LOTEMAX) 0.5 % ophthalmic suspension   Both Eyes   Place  1 drop into both eyes 4 (four) times daily.         Karma Lew (ZYLET) 0.5-0.3 % SUSP   Both Eyes   Place 1 drop into both eyes 4 (four) times daily.         . metoprolol tartrate (LOPRESSOR) 25 MG tablet   Oral   Take 25 mg by mouth daily.         . pravastatin (PRAVACHOL) 20 MG tablet   Oral   Take 20 mg by mouth at bedtime.         . tapentadol (NUCYNTA) 50 MG TABS tablet   Oral   Take 50 mg by mouth 3 (three) times daily.         . traMADol (ULTRAM) 50 MG tablet   Oral   Take 50 mg by mouth every 6 (six) hours as needed for pain.         . cyclobenzaprine (FLEXERIL) 10 MG tablet   Oral   Take 0.5 tablets (5 mg total) by mouth 2 (two) times daily as needed for muscle spasms.   20 tablet   0   . oxyCODONE-acetaminophen (PERCOCET/ROXICET) 5-325 MG per tablet   Oral   Take 1 tablet by mouth every 6 (six) hours as needed for pain.   15 tablet   0    BP 149/63  Pulse 85  Temp(Src) 98.4 F (36.9 C) (Oral)  Resp 22  SpO2 100% Physical Exam  Nursing note and vitals reviewed. Constitutional: She appears well-developed and well-nourished. No distress.  HENT:  Head: Normocephalic and atraumatic.  Eyes: Pupils are equal, round, and reactive to light.  Neck: Normal range of motion. Neck supple. Muscular tenderness present. No spinous process tenderness present. No rigidity. No edema, no erythema and normal range of motion present.  The patient is able to freely move her neck without any discomfort.  Cardiovascular: Normal rate and regular rhythm.   Pulmonary/Chest: Effort normal.  Abdominal: Soft.  Neurological: She is alert.  Skin: Skin is warm and dry.    ED Course  Procedures (including critical care time) Labs Review Labs Reviewed - No data to display Imaging Review No results found.  EKG Interpretation   None       MDM   1. Chronic neck pain  A shunt was treated with a shot of morphine, a shot of Solu-Medrol, a tablet of  Flexeril. She does not have any narcotic pain medication at home and I will prescribe her a short course. She is to followup with her specialist on Thursday.  42 y.o.Reeve L Schuyler's evaluation in the Emergency Department is complete. It has been determined that no acute conditions requiring further emergency intervention are present at this time. The patient/guardian have been advised of the diagnosis and plan. We have discussed signs and symptoms that warrant return to the ED, such as changes or worsening in symptoms.  Vital signs are stable at discharge. Filed Vitals:   04/10/13 1042  BP:   Pulse:   Temp: 98.4 F (36.9 C)  Resp:     Patient/guardian has voiced understanding and agreed to follow-up with the PCP or specialist.     Dorthula Matas, PA-C 04/10/13 1059

## 2013-04-10 NOTE — ED Provider Notes (Signed)
Medical screening examination/treatment/procedure(s) were performed by non-physician practitioner and as supervising physician I was immediately available for consultation/collaboration.  Toy Baker, MD 04/10/13 [phone]

## 2013-04-10 NOTE — ED Notes (Signed)
Heard patient calling out for help. Entered room and observed patient on the floor rolling around in pain. Patient stated that she was having a cramp in her right leg hurting from her foot up to her back. Patient reported to nurse that she fell. While tech sill in room patient continued to yell out in pain and roll around the floor. Once extra staff came to assist, we had patient stand on her left leg and sit back in bed. Applied fall risk braclet, red socks and pulled up both side rails.

## 2013-04-10 NOTE — ED Notes (Addendum)
Pt reports that she had a R leg spasm, and jumped up out of bed and fell on knees. Pt denies any injuries. Abrasion noted to back of L hand knuckles, pt states she is unsure it that was there prior to arrival. No LOC, no bruising noted. Pt put back in bed with side rails raised. Bacitracin applied to knuckles, PA at bedside

## 2013-04-11 ENCOUNTER — Encounter: Payer: Medicaid Other | Admitting: Neurology

## 2013-04-12 ENCOUNTER — Institutional Professional Consult (permissible substitution): Payer: Medicaid Other | Admitting: Internal Medicine

## 2013-04-27 ENCOUNTER — Other Ambulatory Visit: Payer: Self-pay | Admitting: Pain Medicine

## 2013-04-27 DIAGNOSIS — M542 Cervicalgia: Secondary | ICD-10-CM

## 2013-04-27 DIAGNOSIS — M545 Low back pain: Secondary | ICD-10-CM

## 2013-04-29 ENCOUNTER — Other Ambulatory Visit: Payer: Self-pay | Admitting: *Deleted

## 2013-04-29 DIAGNOSIS — IMO0001 Reserved for inherently not codable concepts without codable children: Secondary | ICD-10-CM

## 2013-05-06 ENCOUNTER — Other Ambulatory Visit: Payer: Medicaid Other

## 2013-06-04 ENCOUNTER — Encounter (HOSPITAL_COMMUNITY): Payer: Self-pay | Admitting: Emergency Medicine

## 2013-06-04 ENCOUNTER — Emergency Department (HOSPITAL_COMMUNITY)
Admission: EM | Admit: 2013-06-04 | Discharge: 2013-06-04 | Disposition: A | Discharge status: Home / Self Care | Payer: Medicaid Other | Attending: Emergency Medicine | Admitting: Emergency Medicine

## 2013-06-04 DIAGNOSIS — M25569 Pain in unspecified knee: Secondary | ICD-10-CM | POA: Insufficient documentation

## 2013-06-04 DIAGNOSIS — Z79899 Other long term (current) drug therapy: Secondary | ICD-10-CM | POA: Insufficient documentation

## 2013-06-04 DIAGNOSIS — G589 Mononeuropathy, unspecified: Secondary | ICD-10-CM | POA: Insufficient documentation

## 2013-06-04 DIAGNOSIS — Z87891 Personal history of nicotine dependence: Secondary | ICD-10-CM | POA: Insufficient documentation

## 2013-06-04 DIAGNOSIS — E559 Vitamin D deficiency, unspecified: Secondary | ICD-10-CM | POA: Insufficient documentation

## 2013-06-04 DIAGNOSIS — Z8669 Personal history of other diseases of the nervous system and sense organs: Secondary | ICD-10-CM | POA: Insufficient documentation

## 2013-06-04 DIAGNOSIS — IMO0001 Reserved for inherently not codable concepts without codable children: Secondary | ICD-10-CM | POA: Insufficient documentation

## 2013-06-04 DIAGNOSIS — G629 Polyneuropathy, unspecified: Secondary | ICD-10-CM

## 2013-06-04 DIAGNOSIS — M25519 Pain in unspecified shoulder: Secondary | ICD-10-CM | POA: Insufficient documentation

## 2013-06-04 DIAGNOSIS — E669 Obesity, unspecified: Secondary | ICD-10-CM | POA: Insufficient documentation

## 2013-06-04 DIAGNOSIS — G894 Chronic pain syndrome: Secondary | ICD-10-CM | POA: Insufficient documentation

## 2013-06-04 DIAGNOSIS — I10 Essential (primary) hypertension: Secondary | ICD-10-CM | POA: Insufficient documentation

## 2013-06-04 DIAGNOSIS — Z8742 Personal history of other diseases of the female genital tract: Secondary | ICD-10-CM | POA: Insufficient documentation

## 2013-06-04 DIAGNOSIS — E785 Hyperlipidemia, unspecified: Secondary | ICD-10-CM | POA: Insufficient documentation

## 2013-06-04 DIAGNOSIS — M797 Fibromyalgia: Secondary | ICD-10-CM

## 2013-06-04 DIAGNOSIS — M129 Arthropathy, unspecified: Secondary | ICD-10-CM | POA: Insufficient documentation

## 2013-06-04 MED ORDER — RANITIDINE HCL 300 MG PO TABS: 300.0000 mg | ORAL_TABLET | Freq: Every day | ORAL | Status: DC

## 2013-06-04 MED ORDER — DEXAMETHASONE SODIUM PHOSPHATE 10 MG/ML IJ SOLN
10.0000 mg | Freq: Once | INTRAMUSCULAR | Status: AC
  Administered 2013-06-04: 10 mg via INTRAMUSCULAR
  Filled 2013-06-04: qty 1

## 2013-06-04 MED ORDER — DIAZEPAM 2 MG PO TABS
2.0000 mg | ORAL_TABLET | Freq: Once | ORAL | Status: AC
  Administered 2013-06-04: 2 mg via ORAL
  Filled 2013-06-04: qty 1

## 2013-06-04 MED ORDER — METHOCARBAMOL 500 MG PO TABS: 500.0000 mg | ORAL_TABLET | Freq: Two times a day (BID) | ORAL | Status: DC

## 2013-06-04 MED ORDER — IBUPROFEN 600 MG PO TABS: 600.0000 mg | ORAL_TABLET | Freq: Four times a day (QID) | ORAL | Status: DC | PRN

## 2013-06-04 MED ORDER — GABAPENTIN 300 MG PO CAPS: 300.0000 mg | ORAL_CAPSULE | Freq: Every day | ORAL | Status: DC

## 2013-06-04 MED ORDER — HYDROCODONE-ACETAMINOPHEN 5-325 MG PO TABS: 1.0000 | ORAL_TABLET | Freq: Four times a day (QID) | ORAL | Status: DC | PRN

## 2013-06-04 MED ORDER — DEXAMETHASONE SODIUM PHOSPHATE 10 MG/ML IJ SOLN: 10.0000 mg | Freq: Once | INTRAMUSCULAR | Status: DC

## 2013-06-04 MED ORDER — HYDROMORPHONE HCL PF 2 MG/ML IJ SOLN
2.0000 mg | Freq: Once | INTRAMUSCULAR | Status: AC
  Administered 2013-06-04: 2 mg via INTRAMUSCULAR
  Filled 2013-06-04: qty 1

## 2013-06-04 NOTE — ED Provider Notes (Signed)
CSN: [id number]     Arrival date & time 06/04/13  0845 History   First MD Initiated Contact with Patient 06/04/13 [phone]     Chief Complaint  Patient presents with  . Back Pain   (Consider location/radiation/quality/duration/timing/severity/associated sxs/prior Treatment) HPI Comments: Pt comes in with cc of pain. Pt has hx of fibromyalgia and arthritis. Seen by Rheum, pain medicine and neurology. Pt states that her pain is chronic, however, over the past few days, the pain is worse, and she has been unable to sleep well, as the pain gets worse over the night. Pain is diffuse, - neck, back, legs. Pain is described as crampy, sharp, burning, tingly. No recent trauma. No urinary incontinence, retention, bowel incontinence. Pt ambulating well. Taking advil only for pain at this time.  Patient is a 42 y.o. female presenting with back pain. The history is provided by the patient.  Back Pain Associated symptoms: no abdominal pain, no chest pain, no dysuria, no fever and no headaches     Past Medical History  Diagnosis Date  . Arthritis     neck, central lumbar area  . Neuropathy   . Hypertension   . Cervical spine disease 10/2012  . RP (retinitis pigmentosa)   . Fibroid tumor in breast  . Chronic pain syndrome   . Depression   . Vitamin D deficiency   . Myalgia and myositis, unspecified 04/07/2013  . Obesity   . Headache(784.0)   . Dyslipidemia    Past Surgical History  Procedure Laterality Date  . Tubal ligation     Family History  Problem Relation Age of Onset  . Diabetes Maternal Grandmother   . Alzheimer's disease Maternal Grandmother    History  Substance Use Topics  . Smoking status: Former Smoker -- 0.33 packs/day for 12 years    Types: Cigarettes    Quit date: 06/30/2010  . Smokeless tobacco: Never Used     Comment: 1pack per 3 days.   . Alcohol Use: No   OB History   Grav Para Term Preterm Abortions TAB SAB Ect Mult Living                 Review of Systems   Constitutional: Positive for activity change. Negative for fever.  Respiratory: Negative for shortness of breath.   Cardiovascular: Negative for chest pain.  Gastrointestinal: Negative for nausea, vomiting and abdominal pain.  Genitourinary: Negative for dysuria.  Musculoskeletal: Positive for arthralgias, back pain, joint swelling, myalgias and neck pain. Negative for gait problem and neck stiffness.  Skin: Negative for rash.  Neurological: Negative for headaches.    Allergies  Review of patient's allergies indicates no known allergies.  Home Medications   Current Outpatient Rx  Name  Route  Sig  Dispense  Refill  . butalbital-acetaminophen-caffeine (FIORICET) 50-325-40 MG per tablet   Oral   Take 1 tablet by mouth 4 (four) times daily as needed for headache.         . Cholecalciferol (RA VITAMIN D-3) 1000 UNITS tablet   Oral   Take 1,000 Units by mouth daily.         . citalopram (CELEXA) 20 MG tablet   Oral   Take 20 mg by mouth daily.         . cycloSPORINE (RESTASIS) 0.05 % ophthalmic emulsion   Both Eyes   Place 1 drop into both eyes 2 (two) times daily.         . DULoxetine (CYMBALTA) 30 MG capsule  Oral   Take 30 mg by mouth daily.         Marland Kitchen gabapentin (NEURONTIN) 300 MG capsule   Oral   Take 300 mg by mouth 2 (two) times daily.         . indomethacin (INDOCIN) 25 MG capsule   Oral   Take 25-50 mg by mouth 2 (two) times daily.         Marland Kitchen lisinopril (PRINIVIL,ZESTRIL) 10 MG tablet   Oral   Take 10 mg by mouth daily.         . metoprolol tartrate (LOPRESSOR) 25 MG tablet   Oral   Take 25 mg by mouth daily.         . pravastatin (PRAVACHOL) 20 MG tablet   Oral   Take 20 mg by mouth at bedtime.         . pregabalin (LYRICA) 150 MG capsule   Oral   Take 150 mg by mouth 2 (two) times daily.         . tapentadol (NUCYNTA) 50 MG TABS tablet   Oral   Take 50 mg by mouth every 6 (six) hours as needed for severe pain.          .  traMADol (ULTRAM) 50 MG tablet   Oral   Take 50 mg by mouth every 6 (six) hours as needed for pain.         Marland Kitchen gabapentin (NEURONTIN) 300 MG capsule   Oral   Take 1 capsule (300 mg total) by mouth at bedtime.   30 capsule   0   . HYDROcodone-acetaminophen (NORCO/VICODIN) 5-325 MG per tablet   Oral   Take 1 tablet by mouth every 6 (six) hours as needed.   15 tablet   0   . ibuprofen (ADVIL,MOTRIN) 600 MG tablet   Oral   Take 1 tablet (600 mg total) by mouth every 6 (six) hours as needed.   30 tablet   0   . loteprednol (LOTEMAX) 0.5 % ophthalmic suspension   Both Eyes   Place 1 drop into both eyes 4 (four) times daily.         Karma Lew (ZYLET) 0.5-0.3 % SUSP   Both Eyes   Place 1 drop into both eyes 4 (four) times daily.         . methocarbamol (ROBAXIN) 500 MG tablet   Oral   Take 1 tablet (500 mg total) by mouth 2 (two) times daily.   20 tablet   0   . ranitidine (ZANTAC) 300 MG tablet   Oral   Take 1 tablet (300 mg total) by mouth at bedtime.   30 tablet   3    BP 142/94  Pulse 72  Temp(Src) 98.1 F (36.7 C) (Oral)  Resp 16  SpO2 100% Physical Exam  Nursing note and vitals reviewed. Constitutional: She is oriented to person, place, and time. She appears well-developed and well-nourished.  HENT:  Head: Normocephalic and atraumatic.  Eyes: EOM are normal. Pupils are equal, round, and reactive to light.  Neck: Neck supple.  Cardiovascular: Normal rate, regular rhythm and normal heart sounds.   No murmur heard. Pulmonary/Chest: Effort normal. No respiratory distress.  Abdominal: Soft. She exhibits no distension. There is no tenderness. There is no rebound and no guarding.  Musculoskeletal:  Tenderness to palpation of the shoulder, knees. Able to move neck well. Ambulated well.  Neurological: She is alert and oriented to person, place, and time.  Skin:  Skin is warm and dry.    ED Course  Procedures (including critical care  time) Labs Review Labs Reviewed - No data to display Imaging Review No results found.  EKG Interpretation   None       MDM   1. Fibromyalgia muscle pain   2. Neuropathy    Pt comes in with cc of diffuse body aches. No URI liked sx, and no concerns for flu. Pt has had no recent trauma, strenuous activity, viral syndrome like sx - no clinical concerns for rhabdo, although, she is on statins.  At this time, given the hx of fibromyalgia, neuropathy and hx of similar pain in the past - this appears to be flareup of her chronic condition more than anything else. She described her pain as tingling, burning, stabbing, sharp, crampy - most of them are consistent with neuropathic pain or due to condition like restless leg syndrome. No spinal cord compression concern.  We will d/c with some pain control. Return precautions have been discussed.     Derwood Kaplan, MD 06/04/13 1030

## 2013-06-04 NOTE — ED Notes (Signed)
She c/lo back pain "for a while now" for which she has seen "many specialists".  She has been seen by our provider, and has had imagery performed.

## 2013-06-04 NOTE — ED Notes (Signed)
Pt states history of lower back pain-spasms radiating down both legs for 2-3 days-numbness-waiting to see neuro

## 2013-06-13 ENCOUNTER — Other Ambulatory Visit: Payer: Medicaid Other

## 2013-07-24 ENCOUNTER — Emergency Department (HOSPITAL_COMMUNITY)
Admission: EM | Admit: 2013-07-24 | Discharge: 2013-07-25 | Disposition: A | Discharge status: Home / Self Care | Payer: Medicaid Other | Source: Home / Self Care | Attending: Emergency Medicine | Admitting: Emergency Medicine

## 2013-07-24 ENCOUNTER — Encounter (HOSPITAL_COMMUNITY): Payer: Self-pay | Admitting: Emergency Medicine

## 2013-07-24 ENCOUNTER — Emergency Department (HOSPITAL_COMMUNITY)
Admission: EM | Admit: 2013-07-24 | Discharge: 2013-07-24 | Disposition: A | Discharge status: Home / Self Care | Payer: Medicaid Other | Attending: Emergency Medicine | Admitting: Emergency Medicine

## 2013-07-24 ENCOUNTER — Emergency Department (HOSPITAL_COMMUNITY): Payer: Medicaid Other

## 2013-07-24 DIAGNOSIS — Z79899 Other long term (current) drug therapy: Secondary | ICD-10-CM | POA: Insufficient documentation

## 2013-07-24 DIAGNOSIS — E669 Obesity, unspecified: Secondary | ICD-10-CM | POA: Insufficient documentation

## 2013-07-24 DIAGNOSIS — M545 Low back pain, unspecified: Secondary | ICD-10-CM | POA: Insufficient documentation

## 2013-07-24 DIAGNOSIS — Z8742 Personal history of other diseases of the female genital tract: Secondary | ICD-10-CM | POA: Insufficient documentation

## 2013-07-24 DIAGNOSIS — I10 Essential (primary) hypertension: Secondary | ICD-10-CM

## 2013-07-24 DIAGNOSIS — F329 Major depressive disorder, single episode, unspecified: Secondary | ICD-10-CM

## 2013-07-24 DIAGNOSIS — Z8669 Personal history of other diseases of the nervous system and sense organs: Secondary | ICD-10-CM

## 2013-07-24 DIAGNOSIS — Y92009 Unspecified place in unspecified non-institutional (private) residence as the place of occurrence of the external cause: Secondary | ICD-10-CM | POA: Insufficient documentation

## 2013-07-24 DIAGNOSIS — Y939 Activity, unspecified: Secondary | ICD-10-CM

## 2013-07-24 DIAGNOSIS — G8929 Other chronic pain: Secondary | ICD-10-CM

## 2013-07-24 DIAGNOSIS — M542 Cervicalgia: Secondary | ICD-10-CM | POA: Insufficient documentation

## 2013-07-24 DIAGNOSIS — E785 Hyperlipidemia, unspecified: Secondary | ICD-10-CM | POA: Insufficient documentation

## 2013-07-24 DIAGNOSIS — Z8739 Personal history of other diseases of the musculoskeletal system and connective tissue: Secondary | ICD-10-CM

## 2013-07-24 DIAGNOSIS — Z87891 Personal history of nicotine dependence: Secondary | ICD-10-CM

## 2013-07-24 DIAGNOSIS — S0993XA Unspecified injury of face, initial encounter: Secondary | ICD-10-CM | POA: Insufficient documentation

## 2013-07-24 DIAGNOSIS — Y929 Unspecified place or not applicable: Secondary | ICD-10-CM

## 2013-07-24 DIAGNOSIS — M2550 Pain in unspecified joint: Secondary | ICD-10-CM

## 2013-07-24 DIAGNOSIS — R42 Dizziness and giddiness: Secondary | ICD-10-CM

## 2013-07-24 DIAGNOSIS — IMO0001 Reserved for inherently not codable concepts without codable children: Secondary | ICD-10-CM | POA: Insufficient documentation

## 2013-07-24 DIAGNOSIS — W1809XA Striking against other object with subsequent fall, initial encounter: Secondary | ICD-10-CM | POA: Insufficient documentation

## 2013-07-24 DIAGNOSIS — F3289 Other specified depressive episodes: Secondary | ICD-10-CM | POA: Insufficient documentation

## 2013-07-24 DIAGNOSIS — R296 Repeated falls: Secondary | ICD-10-CM

## 2013-07-24 DIAGNOSIS — F411 Generalized anxiety disorder: Secondary | ICD-10-CM | POA: Insufficient documentation

## 2013-07-24 DIAGNOSIS — M549 Dorsalgia, unspecified: Principal | ICD-10-CM

## 2013-07-24 DIAGNOSIS — M129 Arthropathy, unspecified: Secondary | ICD-10-CM | POA: Insufficient documentation

## 2013-07-24 DIAGNOSIS — S0990XA Unspecified injury of head, initial encounter: Secondary | ICD-10-CM | POA: Insufficient documentation

## 2013-07-24 DIAGNOSIS — S199XXA Unspecified injury of neck, initial encounter: Principal | ICD-10-CM

## 2013-07-24 HISTORY — DX: Fibromyalgia: M79.7

## 2013-07-24 NOTE — ED Provider Notes (Signed)
CSN: [id number]     Arrival date & time 07/24/13  0848 History   First MD Initiated Contact with Patient 07/24/13 [phone]     Chief Complaint  Patient presents with  . Generalized Body Aches  . Fall   (Consider location/radiation/quality/duration/timing/severity/associated sxs/prior Treatment) Patient is a 43 y.o. female presenting with fall. The history is provided by the patient.  Fall   patient here after having a fall at home and striking her head on the bathtub. Possible loss of consciousness that was less than 15 seconds. Complains of bilateral cervical pain along with mild worsening chronic headache. No fever or chills. No vomiting. No neurological changes. No weakness or numbness in the upper or lower extremities. Patient has a history of chronic pain secondary to fibromyalgia. Has used her home medications without relief. Denies any chest or abdominal pain. Symptoms worse with movement nothing makes them better.  Past Medical History  Diagnosis Date  . Arthritis     neck, central lumbar area  . Neuropathy   . Hypertension   . Cervical spine disease 10/2012  . RP (retinitis pigmentosa)   . Fibroid tumor in breast  . Chronic pain syndrome   . Depression   . Vitamin D deficiency   . Myalgia and myositis, unspecified 04/07/2013  . Obesity   . Headache(784.0)   . Dyslipidemia    Past Surgical History  Procedure Laterality Date  . Tubal ligation     Family History  Problem Relation Age of Onset  . Diabetes Maternal Grandmother   . Alzheimer's disease Maternal Grandmother    History  Substance Use Topics  . Smoking status: Former Smoker -- 0.33 packs/day for 12 years    Types: Cigarettes    Quit date: 06/30/2010  . Smokeless tobacco: Never Used     Comment: 1pack per 3 days.   . Alcohol Use: No   OB History   Grav Para Term Preterm Abortions TAB SAB Ect Mult Living                 Review of Systems  All other systems reviewed and are negative.    Allergies   Review of patient's allergies indicates no known allergies.  Home Medications   Current Outpatient Rx  Name  Route  Sig  Dispense  Refill  . butalbital-acetaminophen-caffeine (FIORICET) 50-325-40 MG per tablet   Oral   Take 1 tablet by mouth 4 (four) times daily as needed for headache.         . Cholecalciferol (RA VITAMIN D-3) 1000 UNITS tablet   Oral   Take 1,000 Units by mouth daily.         . citalopram (CELEXA) 20 MG tablet   Oral   Take 20 mg by mouth daily.         . cycloSPORINE (RESTASIS) 0.05 % ophthalmic emulsion   Both Eyes   Place 1 drop into both eyes 2 (two) times daily.         . DULoxetine (CYMBALTA) 30 MG capsule   Oral   Take 30 mg by mouth daily.         Marland Kitchen gabapentin (NEURONTIN) 300 MG capsule   Oral   Take 300 mg by mouth 2 (two) times daily.         Marland Kitchen gabapentin (NEURONTIN) 300 MG capsule   Oral   Take 1 capsule (300 mg total) by mouth at bedtime.   30 capsule   0   . HYDROcodone-acetaminophen (NORCO/VICODIN)  5-325 MG per tablet   Oral   Take 1 tablet by mouth every 6 (six) hours as needed.   15 tablet   0   . ibuprofen (ADVIL,MOTRIN) 600 MG tablet   Oral   Take 1 tablet (600 mg total) by mouth every 6 (six) hours as needed.   30 tablet   0   . indomethacin (INDOCIN) 25 MG capsule   Oral   Take 25-50 mg by mouth 2 (two) times daily.         Marland Kitchen lisinopril (PRINIVIL,ZESTRIL) 10 MG tablet   Oral   Take 10 mg by mouth daily.         Marland Kitchen loteprednol (LOTEMAX) 0.5 % ophthalmic suspension   Both Eyes   Place 1 drop into both eyes 4 (four) times daily.         Lita Mains (ZYLET) 0.5-0.3 % SUSP   Both Eyes   Place 1 drop into both eyes 4 (four) times daily.         . methocarbamol (ROBAXIN) 500 MG tablet   Oral   Take 1 tablet (500 mg total) by mouth 2 (two) times daily.   20 tablet   0   . metoprolol tartrate (LOPRESSOR) 25 MG tablet   Oral   Take 25 mg by mouth daily.         . pravastatin  (PRAVACHOL) 20 MG tablet   Oral   Take 20 mg by mouth at bedtime.         . pregabalin (LYRICA) 150 MG capsule   Oral   Take 150 mg by mouth 2 (two) times daily.         . ranitidine (ZANTAC) 300 MG tablet   Oral   Take 1 tablet (300 mg total) by mouth at bedtime.   30 tablet   3   . tapentadol (NUCYNTA) 50 MG TABS tablet   Oral   Take 50 mg by mouth every 6 (six) hours as needed for severe pain.          . traMADol (ULTRAM) 50 MG tablet   Oral   Take 50 mg by mouth every 6 (six) hours as needed for pain.          BP 151/82  Pulse 99  Temp(Src) 98.4 F (36.9 C) (Oral)  Resp 16  SpO2 100% Physical Exam  Nursing note and vitals reviewed. Constitutional: She is oriented to person, place, and time. She appears well-developed and well-nourished.  Non-toxic appearance. No distress.  HENT:  Head: Normocephalic and atraumatic.  Eyes: Conjunctivae, EOM and lids are normal. Pupils are equal, round, and reactive to light.  Neck: Normal range of motion. Neck supple. No tracheal deviation present. No mass present.  Cardiovascular: Normal rate, regular rhythm and normal heart sounds.  Exam reveals no gallop.   No murmur heard. Pulmonary/Chest: Effort normal and breath sounds normal. No stridor. No respiratory distress. She has no decreased breath sounds. She has no wheezes. She has no rhonchi. She has no rales.  Abdominal: Soft. Normal appearance and bowel sounds are normal. She exhibits no distension. There is no tenderness. There is no rebound and no CVA tenderness.  Musculoskeletal: Normal range of motion. She exhibits no edema and no tenderness.  Neurological: She is alert and oriented to person, place, and time. She has normal strength. No cranial nerve deficit or sensory deficit. GCS eye subscore is 4. GCS verbal subscore is 5. GCS motor subscore is 6.  Skin:  Skin is warm and dry. No abrasion and no rash noted.  Psychiatric: Her speech is normal and behavior is normal. Her  mood appears anxious.    ED Course  Procedures (including critical care time) Labs Review Labs Reviewed - No data to display Imaging Review No results found.  EKG Interpretation   None       MDM  No diagnosis found. Patient's head and neck CT are negative.. She has no other signs of trauma. She is stable for discharge    Leota Jacobsen, MD 07/24/13 1043

## 2013-07-24 NOTE — ED Notes (Signed)
Pt requesting to go to waiting room and asking for soda.  Informed pt that she needed to stay in triage to wait for treatment room due to high fall risk.  Pt refuses and states that she will not get up out of the wheelchair and states that she wants to wait in the waiting area.  Informed pt that she is unable to have anything to drink at this time.  Pt is in wheelchair sitting beside her son.  Notified of need to ask for assistance to get up. Nurse 1st aware of pt.

## 2013-07-24 NOTE — ED Notes (Addendum)
Pt states that she has cervical spine disease and gets off balance.  States that she has frequent falls.  Fell last night in the bathroom.  C/o pain all over.  Denies LOC.  Pt states "I also have a fever".  When asked what her temperature was, she states "that's just it! I run a fever within.  When they take my temperature it is always normal.  It has something to do with my brain.  I probably have MS."

## 2013-07-24 NOTE — ED Notes (Signed)
Patient transported to CT 

## 2013-07-24 NOTE — ED Notes (Addendum)
Pt pulled off C collar prior to ct scan results. Attempted to put towels on side of head as head, but pt refused. Pt states neck collar made her neck itch, slight redness noted to neck. Pt states she feels slightly SOB. Pt speaking in complete sentences with no acute distress. Pt states she "is allergic to everything." Pt requesting alcohol wipes to wipe neck

## 2013-07-24 NOTE — ED Notes (Signed)
Report from [place].  Pt states she fell yesterday and the day before.  C/o neck pain, lower back pain, and chronic pain all over. Pt states she uses a walker at home and has increased falls due to pain and dizziness.

## 2013-07-24 NOTE — Discharge Instructions (Signed)

## 2013-07-25 MED ORDER — DEXAMETHASONE SODIUM PHOSPHATE 10 MG/ML IJ SOLN
10.0000 mg | Freq: Once | INTRAMUSCULAR | Status: AC
  Administered 2013-07-25: 10 mg via INTRAMUSCULAR
  Filled 2013-07-25: qty 1

## 2013-07-25 MED ORDER — KETOROLAC TROMETHAMINE 30 MG/ML IJ SOLN
30.0000 mg | Freq: Once | INTRAMUSCULAR | Status: AC
  Administered 2013-07-25: 30 mg via INTRAMUSCULAR
  Filled 2013-07-25: qty 1

## 2013-07-25 MED ORDER — METHYLPREDNISOLONE SODIUM SUCC 125 MG IJ SOLR: 125.0000 mg | Freq: Once | INTRAMUSCULAR | Status: DC

## 2013-07-25 NOTE — ED Provider Notes (Signed)
Medical screening examination/treatment/procedure(s) were performed by non-physician practitioner and as supervising physician I was immediately available for consultation/collaboration.    Johnna Acosta, MD 07/25/13 [phone]

## 2013-07-25 NOTE — ED Provider Notes (Signed)
CSN: [id number]     Arrival date & time 07/24/13  2120 History   First MD Initiated Contact with Patient 07/25/13 0007     Chief Complaint  Patient presents with  . Fall  . Back Pain   (Consider location/radiation/quality/duration/timing/severity/associated sxs/prior Treatment) HPI Comments: Patient with a significant history for, cervical spine, degenerative disc disease, fibromyalgia, hypertension, neuropathy obesity, hyperlipidemia, depression, with chronic low back pain.  States, that for the past several, days, and the pain is worse than normal, when she stands or changes position.  She becomes dizzy, lasting for several seconds to a minute, and the pain in her low back arches causing her to fall to the floor.  Denies any significant injury from her falls. Patient states she refuses to take Fioricet, Neurontin, Vicodin, Advil , Lyrica, and Ultram, because she does not like the way.  It makes her feel. She does, state that she will take Robaxin, or Flexeril. She was seen by her primary care physician.  Last week, with no medication changes.  She is set up for several specialty evaluations at [facility] within the next month. She states that when she comes to the emergency department.  She gets a pain shot and a steroid shot in her back and this helps her tremendously.   Patient is a 43 y.o. female presenting with fall and back pain. The history is provided by the patient.  Fall This is a chronic problem. The current episode started more than 1 year ago. The problem occurs constantly. The problem has been unchanged. Associated symptoms include arthralgias and neck pain. Pertinent negatives include no chest pain, fever, headaches, nausea, numbness, rash, urinary symptoms or weakness. The symptoms are aggravated by exertion. Treatments tried: Her normal medications. The treatment provided no relief.  Back Pain Associated symptoms: no chest pain, no fever, no headaches, no numbness and no weakness      Past Medical History  Diagnosis Date  . Arthritis     neck, central lumbar area  . Neuropathy   . Hypertension   . Cervical spine disease 10/2012  . RP (retinitis pigmentosa)   . Fibroid tumor in breast  . Chronic pain syndrome   . Depression   . Vitamin D deficiency   . Myalgia and myositis, unspecified 04/07/2013  . Obesity   . Headache(784.0)   . Dyslipidemia   . Fibromyalgia    Past Surgical History  Procedure Laterality Date  . Tubal ligation     Family History  Problem Relation Age of Onset  . Diabetes Maternal Grandmother   . Alzheimer's disease Maternal Grandmother    History  Substance Use Topics  . Smoking status: Former Smoker -- 0.33 packs/day for 12 years    Types: Cigarettes    Quit date: 06/30/2010  . Smokeless tobacco: Never Used     Comment: 1pack per 3 days.   . Alcohol Use: No   OB History   Grav Para Term Preterm Abortions TAB SAB Ect Mult Living                 Review of Systems  Constitutional: Negative for fever.  HENT: Negative for sinus pressure.   Respiratory: Negative for shortness of breath.   Cardiovascular: Negative for chest pain and leg swelling.  Gastrointestinal: Negative for nausea.  Musculoskeletal: Positive for arthralgias, back pain and neck pain.  Skin: Negative for rash.  Neurological: Positive for dizziness. Negative for syncope, speech difficulty, weakness, numbness and headaches.  All other systems reviewed  and are negative.    Allergies  Review of patient's allergies indicates no known allergies.  Home Medications   Current Outpatient Rx  Name  Route  Sig  Dispense  Refill  . butalbital-acetaminophen-caffeine (FIORICET) 50-325-40 MG per tablet   Oral   Take 1 tablet by mouth 4 (four) times daily as needed for headache.         . Cholecalciferol (RA VITAMIN D-3) 1000 UNITS tablet   Oral   Take 1,000 Units by mouth daily.         . clonazePAM (KLONOPIN) 1 MG tablet   Oral   Take 1 mg by mouth 2  (two) times daily.         . cycloSPORINE (RESTASIS) 0.05 % ophthalmic emulsion   Both Eyes   Place 1 drop into both eyes 2 (two) times daily.         . DULoxetine (CYMBALTA) 30 MG capsule   Oral   Take 30 mg by mouth daily.         Marland Kitchen gabapentin (NEURONTIN) 300 MG capsule   Oral   Take 1 capsule (300 mg total) by mouth at bedtime.   30 capsule   0   . indomethacin (INDOCIN) 25 MG capsule   Oral   Take 50 mg by mouth 2 (two) times daily.          Marland Kitchen lisinopril-hydrochlorothiazide (PRINZIDE,ZESTORETIC) 10-12.5 MG per tablet   Oral   Take 1 tablet by mouth daily.         Marland Kitchen loteprednol (LOTEMAX) 0.5 % ophthalmic suspension   Both Eyes   Place 1 drop into both eyes 4 (four) times daily.         . metoprolol tartrate (LOPRESSOR) 25 MG tablet   Oral   Take 25 mg by mouth daily.         . pravastatin (PRAVACHOL) 20 MG tablet   Oral   Take 20 mg by mouth at bedtime.         . pregabalin (LYRICA) 150 MG capsule   Oral   Take 150 mg by mouth 2 (two) times daily.         . ranitidine (ZANTAC) 300 MG tablet   Oral   Take 1 tablet (300 mg total) by mouth at bedtime.   30 tablet   3   . tapentadol (NUCYNTA) 50 MG TABS tablet   Oral   Take 50 mg by mouth every 6 (six) hours as needed for severe pain.          Marland Kitchen topiramate (TOPAMAX) 100 MG tablet   Oral   Take 100 mg by mouth 2 (two) times daily.         . traMADol (ULTRAM) 50 MG tablet   Oral   Take 50 mg by mouth 2 (two) times daily as needed for moderate pain.          BP 153/97  Pulse 92  Temp(Src) 98.1 F (36.7 C) (Oral)  Resp 16  Ht 5\' 2"  (1.575 m)  Wt 234 lb 14 oz (106.539 kg)  BMI 42.95 kg/m2  SpO2 99%  LMP 07/04/2013 Physical Exam  Nursing note and vitals reviewed. Constitutional: She is oriented to person, place, and time. She appears well-developed and well-nourished.  Obese  HENT:  Head: Normocephalic and atraumatic.  Mouth/Throat: Oropharynx is clear and moist.  Eyes:  Pupils are equal, round, and reactive to light.  Neck: Normal range of motion.  Cardiovascular: Normal  rate and regular rhythm.   Pulmonary/Chest: Effort normal and breath sounds normal.  Abdominal: Soft.  Abdominal exam difficult to to body habitus  Genitourinary: Rectal exam shows anal tone normal.  Musculoskeletal: She exhibits tenderness. She exhibits no edema.  Patient sensitive to light touch throughout her back and buttock region  Neurological: She is alert and oriented to person, place, and time.  Skin: Skin is warm. No rash noted.    ED Course  Procedures (including critical care time) Labs Review Labs Reviewed - No data to display Imaging Review Ct Head Wo Contrast  07/24/2013   CLINICAL DATA:  Fall, head trauma, neck pain  EXAM: CT HEAD WITHOUT CONTRAST  CT CERVICAL SPINE WITHOUT CONTRAST  TECHNIQUE: Multidetector CT imaging of the head and cervical spine was performed following the standard protocol without intravenous contrast. Multiplanar CT image reconstructions of the cervical spine were also generated.  COMPARISON:  None.  FINDINGS: CT HEAD FINDINGS  No acute hemorrhage, infarct, or mass lesion is identified. No midline shift. Ventricles are normal in size. Orbits and paranasal sinuses are unremarkable. No skull fracture.  CT CERVICAL SPINE FINDINGS  C1 through the cervicothoracic junction is visualized in its entirety. Straightening of the normal cervical lordosis is noted but alignment is otherwise normal. Mild uncovertebral joint hypertrophy noted with disc degenerative change most prominent at C5-C6. No significant neural foraminal narrowing identified. Vertebral body heights are preserved. No fracture or dislocation. No precervical soft tissue widening.  IMPRESSION: No acute intracranial finding.  No cervical spine fracture or dislocation.   Electronically Signed   By: Conchita Paris M.D.   On: 07/24/2013 10:25   Ct Cervical Spine Wo Contrast  07/24/2013   CLINICAL DATA:   Fall, head trauma, neck pain  EXAM: CT HEAD WITHOUT CONTRAST  CT CERVICAL SPINE WITHOUT CONTRAST  TECHNIQUE: Multidetector CT imaging of the head and cervical spine was performed following the standard protocol without intravenous contrast. Multiplanar CT image reconstructions of the cervical spine were also generated.  COMPARISON:  None.  FINDINGS: CT HEAD FINDINGS  No acute hemorrhage, infarct, or mass lesion is identified. No midline shift. Ventricles are normal in size. Orbits and paranasal sinuses are unremarkable. No skull fracture.  CT CERVICAL SPINE FINDINGS  C1 through the cervicothoracic junction is visualized in its entirety. Straightening of the normal cervical lordosis is noted but alignment is otherwise normal. Mild uncovertebral joint hypertrophy noted with disc degenerative change most prominent at C5-C6. No significant neural foraminal narrowing identified. Vertebral body heights are preserved. No fracture or dislocation. No precervical soft tissue widening.  IMPRESSION: No acute intracranial finding.  No cervical spine fracture or dislocation.   Electronically Signed   By: Conchita Paris M.D.   On: 07/24/2013 10:25    EKG Interpretation   None      Patient is not orthostatic by blood pressure or pulse MDM   1. Chronic back pain greater than 3 months duration     Patient was given a shot of IM, to oral as well as IM, Decadron.  She's been referred back to her primary care physician for her chronic pain issues    Garald Balding, NP 07/25/13 [phone]

## 2013-07-25 NOTE — ED Notes (Signed)
The pt is c/o pain all  Over her body she has numbness from her lower back down today.  She can weight bear without difficulty.  She is c/o pain and numbness a headache and transient numbness all over her body

## 2013-07-25 NOTE — ED Notes (Signed)
im med given  For pain and discomfort

## 2013-07-25 NOTE — Discharge Instructions (Signed)
Please make an appointment with your primary care physician for further evaluation of your chronic pain.  Make sure to keep your appointments with your specialist.  Hopefully, one of the specialist can find a trigger for your pain and get you more comfortable

## 2013-07-25 NOTE — ED Notes (Signed)
The pt is sleeping soundly .  Her son reports that she is very hard to wake up when she sleeps

## 2013-08-08 ENCOUNTER — Other Ambulatory Visit: Payer: Self-pay | Admitting: Internal Medicine

## 2013-08-08 ENCOUNTER — Telehealth: Payer: Self-pay | Admitting: Neurology

## 2013-08-08 DIAGNOSIS — M62838 Other muscle spasm: Secondary | ICD-10-CM

## 2013-08-08 NOTE — Telephone Encounter (Signed)
I called patient. The patient is having ongoing headaches, pain in the neck. The patient reports troubles with concentration. The patient was last seen in October 2014, set up for EMG and nerve conduction studies but she never showed up for the test. I will get a revisit for this patient.

## 2013-08-08 NOTE — Telephone Encounter (Signed)
Gave request to Dr. Tobey Grim assistant Davy Pique, CMA and she said that she would make the appt.

## 2013-08-08 NOTE — Telephone Encounter (Signed)
PT called in and stated that she is still having headaches and the medicine is not working.  She said she feels like something is moving around in her head, and that it feels as if her eyes and her head need to explode.  She is also having numbness and tingling and is now having to use a walker.  She was scheduled for a NCV/EMG here last year however she states that she had previously had these tests done at Dr. Reynaldo Minium office - [facility].  She has also asked if it would be possible for her to change to a female doctor.  I explained that she would have to write a letter stating why she wanted to change doctors before we could make a change.   She [place] unable to turn her head or neck and she is aware and has been previously diagnosed with fibromyalgia, RP, severe nerve damage and C6 & C7 degenerative disease in her neck. She has also had various MRIs and CTs at the hospital and she wants more serious testing to be done as she wants to find out what is going on and how can it be fixed so that she can go back to feeling like her old self.  She also stated she was on 36-37 different medications and wants to get off some of them.  She doesn't drive and she stated that she thought Dr. Caryl Pina was going to resend a referral so that she could get a different doctor and possibly get in faster.  Please call.  Thank you

## 2013-08-09 ENCOUNTER — Ambulatory Visit
Admission: RE | Admit: 2013-08-09 | Discharge: 2013-08-09 | Disposition: A | Discharge status: Home / Self Care | Payer: Medicaid Other | Source: Ambulatory Visit | Attending: Internal Medicine | Admitting: Internal Medicine

## 2013-08-09 ENCOUNTER — Telehealth: Payer: Self-pay | Admitting: *Deleted

## 2013-08-09 DIAGNOSIS — M62838 Other muscle spasm: Secondary | ICD-10-CM

## 2013-08-09 NOTE — Telephone Encounter (Signed)
Called patient to schedule 3 wk f/u with Dr. Jannifer Franklin.

## 2013-08-11 ENCOUNTER — Ambulatory Visit (INDEPENDENT_AMBULATORY_CARE_PROVIDER_SITE_OTHER): Payer: Medicaid Other | Admitting: Neurology

## 2013-08-11 ENCOUNTER — Encounter: Payer: Self-pay | Admitting: Neurology

## 2013-08-11 ENCOUNTER — Encounter (INDEPENDENT_AMBULATORY_CARE_PROVIDER_SITE_OTHER): Payer: Self-pay

## 2013-08-11 VITALS — BP 146/84 | HR 94 | Ht 64.0 in | Wt 230.0 lb

## 2013-08-11 DIAGNOSIS — R413 Other amnesia: Secondary | ICD-10-CM

## 2013-08-11 DIAGNOSIS — IMO0001 Reserved for inherently not codable concepts without codable children: Secondary | ICD-10-CM

## 2013-08-11 HISTORY — DX: Other amnesia: R41.3

## 2013-08-11 NOTE — Progress Notes (Signed)
Reason for visit: Fibromyalgia  Donna Coleman is an 43 y.o. female  History of present illness:  Donna Coleman is a 43 year old right-handed black female with a history of fibromyalgia. The patient has diffuse total body pain with prominent pain displays with light touch throughout the body. The patient was last seen in October 2014. The patient indicates that she has numbness and tingling throughout the body, and she was set up for EMG and nerve conduction studies that were never done. The patient comes in today indicating that she has already had nerve conduction studies and EMG done through a pain doctor, and she was told that she had a severe peripheral neuropathy. The patient indicates that the neuropathy was worked up with blood work. The patient also has arthralgias particularly in the hands, and she has been seen through a rheumatologist at [facility], and further blood work was done. The results of the studies are unavailable to me. The patient now reports some problems with cognitive issues, and word finding problems. The patient however, is on 200 mg daily of Topamax which may cause of these issues. The patient continues to have troubles with balance, but she comes in today without using a cane or a walker. The patient is mainly concerned today with the memory and word finding problems.  Past Medical History  Diagnosis Date  . Arthritis     neck, central lumbar area  . Neuropathy   . Hypertension   . Cervical spine disease 10/2012  . RP (retinitis pigmentosa)   . Fibroid tumor in breast  . Chronic pain syndrome   . Depression   . Vitamin D deficiency   . Myalgia and myositis, unspecified 04/07/2013  . Obesity   . Headache(784.0)   . Dyslipidemia   . Fibromyalgia   . Memory deficits 08/11/2013    Past Surgical History  Procedure Laterality Date  . Tubal ligation      Family History  Problem Relation Age of Onset  . Diabetes Maternal Grandmother   . Alzheimer's disease  Maternal Grandmother     Social history:  reports that she quit smoking about 3 years ago. Her smoking use included Cigarettes. She has a 3.96 pack-year smoking history. She has never used smokeless tobacco. She reports that she does not drink alcohol or use illicit drugs.   No Known Allergies  Medications:  Current Outpatient Prescriptions on File Prior to Visit  Medication Sig Dispense Refill  . butalbital-acetaminophen-caffeine (FIORICET) 50-325-40 MG per tablet Take 1 tablet by mouth 4 (four) times daily as needed for headache.      . Cholecalciferol (RA VITAMIN D-3) 1000 UNITS tablet Take 1,000 Units by mouth daily.      . clonazePAM (KLONOPIN) 1 MG tablet Take 1 mg by mouth 2 (two) times daily.      . cycloSPORINE (RESTASIS) 0.05 % ophthalmic emulsion Place 1 drop into both eyes 2 (two) times daily.      Marland Kitchen gabapentin (NEURONTIN) 300 MG capsule Take 1 capsule (300 mg total) by mouth at bedtime.  30 capsule  0  . indomethacin (INDOCIN) 25 MG capsule Take 50 mg by mouth 2 (two) times daily.       Marland Kitchen lisinopril-hydrochlorothiazide (PRINZIDE,ZESTORETIC) 10-12.5 MG per tablet Take 1 tablet by mouth daily.      Marland Kitchen loteprednol (LOTEMAX) 0.5 % ophthalmic suspension Place 1 drop into both eyes 4 (four) times daily.      . metoprolol tartrate (LOPRESSOR) 25 MG tablet Take 25  mg by mouth daily.      . pravastatin (PRAVACHOL) 20 MG tablet Take 20 mg by mouth at bedtime.      . pregabalin (LYRICA) 150 MG capsule Take 150 mg by mouth 2 (two) times daily.      . ranitidine (ZANTAC) 300 MG tablet Take 1 tablet (300 mg total) by mouth at bedtime.  30 tablet  3  . tapentadol (NUCYNTA) 50 MG TABS tablet Take 50 mg by mouth every 6 (six) hours as needed for severe pain.       Marland Kitchen topiramate (TOPAMAX) 100 MG tablet Take 100 mg by mouth 2 (two) times daily.      . traMADol (ULTRAM) 50 MG tablet Take 50 mg by mouth 2 (two) times daily as needed for moderate pain.      . DULoxetine (CYMBALTA) 30 MG capsule Take 30  mg by mouth daily.       No current facility-administered medications on file prior to visit.    ROS:  Out of a complete 14 system review of symptoms, the patient complains only of the following symptoms, and all other reviewed systems are negative.  Chills, fatigue, fever Gait instability Total body pain, numbness, memory problems  Blood pressure 146/84, pulse 94, height 5\' 4"  (1.626 m), weight 230 lb (104.327 kg), last menstrual period 07/04/2013.  Physical Exam  General: The patient is alert and cooperative at the time of the examination. The patient is markedly obese.  Neuromuscular: The patient will only lateral a rotate the head 10 in either direction.  Skin: 2+ edema at the ankles is noted bilaterally.   Neurologic Exam  Mental status: The Mini-Mental status examination done today shows a total score 25/30. Animal fluency test was 9.  Cranial nerves: Facial symmetry is present. Speech is normal, no aphasia or dysarthria is noted. Extraocular movements are full. Visual fields are full.  Motor: The patient has poor motor effort throughout, indicating pain with every movement of the arms and legs.  Sensory examination: Soft touch sensation is symmetric, but the patient has prominent pain displays with very light touch on the arms and legs.  Coordination: The patient has good finger-nose-finger and heel-to-shin bilaterally.  Gait and station: The patient has a stooped posture, wide-based gait.  Reflexes: Deep tendon reflexes are symmetric, but are depressed.   CT brain 07/24/13:  IMPRESSION:  No acute intracranial finding.    Assessment/Plan:  1. Fibromyalgia  2. Numbness, paresthesias all fours, possible neuropathy  3. Gait disorder  4. Cognitive dysfunction  The patient once again has very prominent pain displays with extremely light touch on the skin, the patient demonstrates severe pain, poor motor effort on examination. The patient has been seen by a  multitude of physicians, and the patient indicates that she has been told that she has a peripheral neuropathy, but she would not allow for EMG evaluation. The patient has had blood work done, results of this are not available to me. The patient has been seen by a multitude of other physicians, and her medical information appears to be fragmented. The patient needs to consolidate physicians, and go back to her pain doctors who have done the neuropathy workup. The current cognitive complaints may be related to Topamax, and this should be tapered off by 50 mg every 2 weeks until off the medication. The patient will be set up for MRI evaluation of the brain. The patient will followup through this office if needed. I strongly suspect that there is  a component of functional overlay and symptom magnification on the clinical examination.  Jill Alexanders MD 08/11/2013 7:48 PM  [facility] [address] [place], [place] [address]  Phone [phone] Fax [phone]

## 2013-08-11 NOTE — Patient Instructions (Addendum)
Fibromyalgia Fibromyalgia is a disorder that is often misunderstood. It is associated with muscular pains and tenderness that comes and goes. It is often associated with fatigue and sleep disturbances. Though it tends to be long-lasting, fibromyalgia is not life-threatening. CAUSES  The exact cause of fibromyalgia is unknown. People with certain gene types are predisposed to developing fibromyalgia and other conditions. Certain factors can play a role as triggers, such as:  Spine disorders.  Arthritis.  Severe injury (trauma) and other physical stressors.  Emotional stressors. SYMPTOMS   The main symptom is pain and stiffness in the muscles and joints, which can vary over time.  Sleep and fatigue problems. Other related symptoms may include:  Bowel and bladder problems.  Headaches.  Visual problems.  Problems with odors and noises.  Depression or mood changes.  Painful periods (dysmenorrhea).  Dryness of the skin or eyes. DIAGNOSIS  There are no specific tests for diagnosing fibromyalgia. Patients can be diagnosed accurately from the specific symptoms they have. The diagnosis is made by determining that nothing else is causing the problems. TREATMENT  There is no cure. Management includes medicines and an active, healthy lifestyle. The goal is to enhance physical fitness, decrease pain, and improve sleep. HOME CARE INSTRUCTIONS   Only take over-the-counter or prescription medicines as directed by your caregiver. Sleeping pills, tranquilizers, and pain medicines may make your problems worse.  Low-impact aerobic exercise is very important and advised for treatment. At first, it may seem to make pain worse. Gradually increasing your tolerance will overcome this feeling.  Learning relaxation techniques and how to control stress will help you. Biofeedback, visual imagery, hypnosis, muscle relaxation, yoga, and meditation are all options.  Anti-inflammatory medicines and  physical therapy may provide short-term help.  Acupuncture or massage treatments may help.  Take muscle relaxant medicines as suggested by your caregiver.  Avoid stressful situations.  Plan a healthy lifestyle. This includes your diet, sleep, rest, exercise, and friends.  Find and practice a hobby you enjoy.  Join a fibromyalgia support group for interaction, ideas, and sharing advice. This may be helpful. SEEK MEDICAL CARE IF:  You are not having good results or improvement from your treatment. FOR MORE INFORMATION  National Fibromyalgia Association: www.fmaware.Ross: www.arthritis.org Document Released: 06/16/2005 Document Revised: 09/08/2011 Document Reviewed: 09/26/2009 [facility] Patient Information 2014 [place], [place].     Would consider a taper off of the Topamax, as this may result in cognitive dysfunction.

## 2013-08-15 ENCOUNTER — Ambulatory Visit
Admission: RE | Admit: 2013-08-15 | Discharge: 2013-08-15 | Disposition: A | Discharge status: Home / Self Care | Payer: Medicaid Other | Source: Ambulatory Visit | Attending: Neurology | Admitting: Neurology

## 2013-08-15 ENCOUNTER — Telehealth: Payer: Self-pay | Admitting: Neurology

## 2013-08-15 DIAGNOSIS — R413 Other amnesia: Secondary | ICD-10-CM

## 2013-08-15 DIAGNOSIS — IMO0001 Reserved for inherently not codable concepts without codable children: Secondary | ICD-10-CM

## 2013-08-15 NOTE — Telephone Encounter (Signed)
MRI the brain was normal. I discussed this with patient. Her cognitive issues may be in part related to fibromyalgia.

## 2013-09-07 ENCOUNTER — Telehealth: Payer: Self-pay | Admitting: Neurology

## 2013-09-07 NOTE — Telephone Encounter (Signed)
Patient calling to schedule an appointment with Dr. Jannifer Franklin due to her symptoms getting worse and she believes she has MS. Patient describes how she still has confusion even though she stopped taking Topamax per Dr Jannifer Franklin' instructions and she describes "something is off." Please call patient and advise.

## 2013-09-13 ENCOUNTER — Ambulatory Visit (INDEPENDENT_AMBULATORY_CARE_PROVIDER_SITE_OTHER): Payer: Medicaid Other | Admitting: Neurology

## 2013-09-13 ENCOUNTER — Encounter: Payer: Self-pay | Admitting: Neurology

## 2013-09-13 ENCOUNTER — Encounter (INDEPENDENT_AMBULATORY_CARE_PROVIDER_SITE_OTHER): Payer: Self-pay

## 2013-09-13 VITALS — BP 164/94 | HR 94 | Ht 64.0 in | Wt 229.0 lb

## 2013-09-13 DIAGNOSIS — R413 Other amnesia: Secondary | ICD-10-CM

## 2013-09-13 DIAGNOSIS — IMO0001 Reserved for inherently not codable concepts without codable children: Secondary | ICD-10-CM

## 2013-09-13 NOTE — Patient Instructions (Signed)

## 2013-09-13 NOTE — Progress Notes (Signed)
Reason for visit: Memory disturbance  Donna Coleman is an 43 y.o. female  History of present illness:  Ms. Demmon is a 43 year old right-handed black female with a history of fibromyalgia. The patient has diffuse pain to very light touch, and she has very prominent pain displays. The patient has continued to be followed through the [facility]. The patient underwent a MRI of the low back which showed some mild arthritis and disc bulges. The patient was told that she needed a procedure to "burn the nerves". The patient remains markedly overweight. The patient has been taken off of the Topamax, but she still indicates that she has ongoing memory problems. MRI of the brain was completely normal. The patient indicates that she does not sleep well secondary to pain. The patient has been set up for a sleep study previously, but she never got the study done because she says she cannot leave her son who requires 24/7 supervision. The patient returns to the office today. The patient is on Cymbalta for pain.  Past Medical History  Diagnosis Date  . Arthritis     neck, central lumbar area  . Neuropathy   . Hypertension   . Cervical spine disease 10/2012  . RP (retinitis pigmentosa)   . Fibroid tumor in breast  . Chronic pain syndrome   . Depression   . Vitamin D deficiency   . Myalgia and myositis, unspecified 04/07/2013  . Obesity   . Headache(784.0)   . Dyslipidemia   . Fibromyalgia   . Memory deficits 08/11/2013    Past Surgical History  Procedure Laterality Date  . Tubal ligation      Family History  Problem Relation Age of Onset  . Diabetes Maternal Grandmother   . Alzheimer's disease Maternal Grandmother     Social history:  reports that she quit smoking about 3 years ago. Her smoking use included Cigarettes. She has a 3.96 pack-year smoking history. She has never used smokeless tobacco. She reports that she does not drink alcohol or use illicit drugs.   No Known Allergies  Medications:  Current Outpatient Prescriptions on File Prior to Visit  Medication Sig Dispense Refill  . butalbital-acetaminophen-caffeine (FIORICET) 50-325-40 MG per tablet Take 1 tablet by mouth 4 (four) times daily as needed for headache.      . Cholecalciferol (RA VITAMIN D-3) 1000 UNITS tablet Take 1,000 Units by mouth daily.      . clonazePAM (KLONOPIN) 1 MG tablet Take 1 mg by mouth 2 (two) times daily.      . cyclobenzaprine (FLEXERIL) 10 MG tablet Take 10 mg by mouth as needed for muscle spasms.      . cycloSPORINE (RESTASIS) 0.05 % ophthalmic emulsion Place 1 drop into both eyes 2 (two) times daily.      Marland Kitchen gabapentin (NEURONTIN) 300 MG capsule Take 1 capsule (300 mg total) by mouth at bedtime.  30 capsule  0  . indomethacin (INDOCIN) 25 MG capsule Take 50 mg by mouth 2 (two) times daily.       Marland Kitchen lisinopril-hydrochlorothiazide (PRINZIDE,ZESTORETIC) 10-12.5 MG per tablet Take 1 tablet by mouth daily.      Marland Kitchen loteprednol (LOTEMAX) 0.5 % ophthalmic suspension Place 1 drop into both eyes 4 (four) times daily.      . metoprolol tartrate (LOPRESSOR) 25 MG tablet Take 25 mg by mouth daily.      . pravastatin (PRAVACHOL) 20 MG tablet Take 20 mg by mouth at bedtime.      Marland Kitchen  pregabalin (LYRICA) 150 MG capsule Take 150 mg by mouth 2 (two) times daily.      . ranitidine (ZANTAC) 300 MG tablet Take 1 tablet (300 mg total) by mouth at bedtime.  30 tablet  3  . tapentadol (NUCYNTA) 50 MG TABS tablet Take 50 mg by mouth 2 (two) times daily.       . traMADol (ULTRAM) 50 MG tablet Take 50 mg by mouth 2 (two) times daily as needed for moderate pain.       No current facility-administered medications on file prior to visit.    ROS:  Out of a complete 14 system review of symptoms, the patient complains only of the following symptoms, and all other reviewed systems are negative.  Activity change, chills, fatigue, fever, weight gain Neck pain, neck stiffness, hearing loss, ear pain,  ringing in the ears Light sensitivity, loss of vision Chest tightness Chest pain, palpitations, heart murmur Excessive thirst Nausea, vomiting Restless legs, insomnia, apnea Incontinence of bladder, frequency of urination Joint pain, joint swelling, back pain, achy muscles, muscle cramps, walking difficulties, coordination problems Bruising easily Memory loss, dizziness, headache, numbness, speech difficulties, weakness, tremors, facial drooping Agitation, confusion, depression, anxiety  Blood pressure 164/94, pulse 94, height 5\' 4"  (1.626 m), weight 229 lb (103.874 kg), last menstrual period 08/16/2013.  Physical Exam  General: The patient is alert and cooperative at the time of the examination. The patient is markedly obese.  Skin: No significant peripheral edema is noted.   Neurologic Exam  Mental status: The patient is oriented x 3.  Cranial nerves: Facial symmetry is present. Speech is normal, no aphasia or dysarthria is noted. Extraocular movements are full. Visual fields are full.  Motor: The patient has good strength in all 4 extremities.  Sensory examination: The patient is hypersensitive to soft touch, prominent pain displays. The patient indicates that there is more pain on the right face, arm, and leg as compared to left. Vibration sensation is symmetric on the face, arms, and legs.  Coordination: The patient has good finger-nose-finger and heel-to-shin bilaterally.  Gait and station: The patient has a slightly wide-based, limping type gait. Tandem gait was not attempted. The patient falls backwards or to the side with Romberg. No drift is seen.  Reflexes: Deep tendon reflexes are symmetric.   Assessment/Plan:  1. Reported memory disturbance  2. History of fibromyalgia  3. Prominent pain displays, poor motor effort  The patient appears to have a very significant degree of functional overlay with the clinical examination. The patient continues to report some  memory issues. MRI of the brain was completely normal. The patient will undergo neuropsychological evaluation. The patient will followup in one year. I do not believe that the patient has any significant neurologic organic disease. I have recommended the patient engage in regular exercise and a weight loss program. The patient indicates that she wants to be referred for physical therapy, and then she states that she cannot do physical therapy because she hurts too much.  Jill Alexanders MD 09/13/2013 7:40 PM  [facility] [address] [place], [place] [address]  Phone [phone] Fax [phone]

## 2013-09-28 ENCOUNTER — Ambulatory Visit: Payer: Medicaid Other | Attending: Internal Medicine | Admitting: Physical Therapy

## 2013-09-28 DIAGNOSIS — IMO0001 Reserved for inherently not codable concepts without codable children: Secondary | ICD-10-CM | POA: Diagnosis not present

## 2013-09-28 DIAGNOSIS — R293 Abnormal posture: Secondary | ICD-10-CM | POA: Insufficient documentation

## 2013-09-28 DIAGNOSIS — R5381 Other malaise: Secondary | ICD-10-CM | POA: Insufficient documentation

## 2013-09-28 DIAGNOSIS — R262 Difficulty in walking, not elsewhere classified: Secondary | ICD-10-CM | POA: Diagnosis not present

## 2013-09-28 DIAGNOSIS — M2550 Pain in unspecified joint: Secondary | ICD-10-CM | POA: Diagnosis not present

## 2013-10-30 ENCOUNTER — Emergency Department (HOSPITAL_COMMUNITY)
Admission: EM | Admit: 2013-10-30 | Discharge: 2013-10-30 | Disposition: A | Discharge status: Home / Self Care | Payer: Medicaid Other | Attending: Emergency Medicine | Admitting: Emergency Medicine

## 2013-10-30 ENCOUNTER — Encounter (HOSPITAL_COMMUNITY): Payer: Self-pay | Admitting: Emergency Medicine

## 2013-10-30 DIAGNOSIS — E669 Obesity, unspecified: Secondary | ICD-10-CM | POA: Insufficient documentation

## 2013-10-30 DIAGNOSIS — Z79899 Other long term (current) drug therapy: Secondary | ICD-10-CM | POA: Insufficient documentation

## 2013-10-30 DIAGNOSIS — F3289 Other specified depressive episodes: Secondary | ICD-10-CM | POA: Insufficient documentation

## 2013-10-30 DIAGNOSIS — E785 Hyperlipidemia, unspecified: Secondary | ICD-10-CM | POA: Insufficient documentation

## 2013-10-30 DIAGNOSIS — M5030 Other cervical disc degeneration, unspecified cervical region: Secondary | ICD-10-CM | POA: Insufficient documentation

## 2013-10-30 DIAGNOSIS — M25579 Pain in unspecified ankle and joints of unspecified foot: Secondary | ICD-10-CM | POA: Insufficient documentation

## 2013-10-30 DIAGNOSIS — IMO0002 Reserved for concepts with insufficient information to code with codable children: Secondary | ICD-10-CM

## 2013-10-30 DIAGNOSIS — M797 Fibromyalgia: Secondary | ICD-10-CM

## 2013-10-30 DIAGNOSIS — M2550 Pain in unspecified joint: Secondary | ICD-10-CM

## 2013-10-30 DIAGNOSIS — M5137 Other intervertebral disc degeneration, lumbosacral region: Secondary | ICD-10-CM | POA: Insufficient documentation

## 2013-10-30 DIAGNOSIS — Z8669 Personal history of other diseases of the nervous system and sense organs: Secondary | ICD-10-CM | POA: Insufficient documentation

## 2013-10-30 DIAGNOSIS — Z87891 Personal history of nicotine dependence: Secondary | ICD-10-CM | POA: Insufficient documentation

## 2013-10-30 DIAGNOSIS — I10 Essential (primary) hypertension: Secondary | ICD-10-CM | POA: Insufficient documentation

## 2013-10-30 DIAGNOSIS — F329 Major depressive disorder, single episode, unspecified: Secondary | ICD-10-CM | POA: Insufficient documentation

## 2013-10-30 DIAGNOSIS — G8929 Other chronic pain: Secondary | ICD-10-CM | POA: Insufficient documentation

## 2013-10-30 DIAGNOSIS — M51379 Other intervertebral disc degeneration, lumbosacral region without mention of lumbar back pain or lower extremity pain: Secondary | ICD-10-CM | POA: Insufficient documentation

## 2013-10-30 DIAGNOSIS — Z8742 Personal history of other diseases of the female genital tract: Secondary | ICD-10-CM | POA: Insufficient documentation

## 2013-10-30 MED ORDER — DEXAMETHASONE SODIUM PHOSPHATE 10 MG/ML IJ SOLN
10.0000 mg | Freq: Once | INTRAMUSCULAR | Status: AC
  Administered 2013-10-30: 10 mg via INTRAMUSCULAR
  Filled 2013-10-30: qty 1

## 2013-10-30 MED ORDER — PREDNISONE 20 MG PO TABS: ORAL_TABLET | ORAL | Status: DC

## 2013-10-30 MED ORDER — OXYCODONE-ACETAMINOPHEN 5-325 MG PO TABS: 2.0000 | ORAL_TABLET | ORAL | Status: DC | PRN

## 2013-10-30 NOTE — ED Notes (Signed)
Pt sts 2 weeks ago she was at her "pain doctor and they pushed needles in my lumbar area" and now she sts pain in neck, back, bursitis, swollen ankle as well as prolonged period x 1.5 month

## 2013-10-30 NOTE — ED Provider Notes (Signed)
CSN: [id number]     Arrival date & time 10/30/13  0850 History   First MD Initiated Contact with Patient 10/30/13 0935     Chief Complaint  Patient presents with  . Ankle Pain  . Neck Pain  . Back Pain     (Consider location/radiation/quality/duration/timing/severity/associated sxs/prior Treatment) HPI Comments: Patient presents to ER with diffuse pain. Patient reports a history of degenerative disc disease in neck and lower back, arthritis, bursitis and fibromyalgia. She has stopped taking most of her medications because of side effects. She has been on hydrocodone, oxycodone, fentanyl Duragesic, Ultram in the past. Each of them and had some side effects which have caused her to stop. She has run out of her gabapentin, but does have a prescription waiting at the pharmacy.  She reports moderate to severe pain "all over". She indicates that she is having pain in the neck, back, ankles as well as hips. Pain worsens with movement. No redness or swelling of the joints. She denies injury.  Patient is a 43 y.o. female presenting with ankle pain, neck pain, and back pain.  Ankle Pain Associated symptoms: back pain and neck pain   Neck Pain Back Pain   Past Medical History  Diagnosis Date  . Arthritis     neck, central lumbar area  . Neuropathy   . Hypertension   . Cervical spine disease 10/2012  . RP (retinitis pigmentosa)   . Fibroid tumor in breast  . Chronic pain syndrome   . Depression   . Vitamin D deficiency   . Myalgia and myositis, unspecified 04/07/2013  . Obesity   . Headache(784.0)   . Dyslipidemia   . Fibromyalgia   . Memory deficits 08/11/2013   Past Surgical History  Procedure Laterality Date  . Tubal ligation     Family History  Problem Relation Age of Onset  . Diabetes Maternal Grandmother   . Alzheimer's disease Maternal Grandmother    History  Substance Use Topics  . Smoking status: Former Smoker -- 0.33 packs/day for 12 years    Types: Cigarettes    Quit  date: 06/30/2010  . Smokeless tobacco: Never Used     Comment: 1pack per 3 days.   . Alcohol Use: No   OB History   Grav Para Term Preterm Abortions TAB SAB Ect Mult Living                 Review of Systems  Musculoskeletal: Positive for arthralgias, back pain, myalgias and neck pain.  All other systems reviewed and are negative.     Allergies  Review of patient's allergies indicates no known allergies.  Home Medications   Prior to Admission medications   Medication Sig Start Date End Date Taking? Authorizing Provider  butalbital-acetaminophen-caffeine (FIORICET) 50-325-40 MG per tablet Take 1 tablet by mouth 4 (four) times daily as needed for headache.    Historical Provider, MD  carisoprodol (SOMA) 350 MG tablet Take 350 mg by mouth 2 (two) times daily.    Historical Provider, MD  Cholecalciferol (RA VITAMIN D-3) 1000 UNITS tablet Take 1,000 Units by mouth daily.    Historical Provider, MD  clonazePAM (KLONOPIN) 1 MG tablet Take 1 mg by mouth 2 (two) times daily.    Historical Provider, MD  cyclobenzaprine (FLEXERIL) 10 MG tablet Take 10 mg by mouth as needed for muscle spasms.    Historical Provider, MD  cycloSPORINE (RESTASIS) 0.05 % ophthalmic emulsion Place 1 drop into both eyes 2 (two) times daily.  Historical Provider, MD  DULoxetine (CYMBALTA) 60 MG capsule Take 60 mg by mouth daily.    Historical Provider, MD  fentaNYL (DURAGESIC - DOSED MCG/HR) 25 MCG/HR patch Place 25 mcg onto the skin every 3 (three) days.    Historical Provider, MD  gabapentin (NEURONTIN) 300 MG capsule Take 1 capsule (300 mg total) by mouth at bedtime. 06/04/13   [name], MD  indomethacin (INDOCIN) 25 MG capsule Take 50 mg by mouth 2 (two) times daily.     Historical Provider, MD  lisinopril-hydrochlorothiazide (PRINZIDE,ZESTORETIC) 10-12.5 MG per tablet Take 1 tablet by mouth daily.    Historical Provider, MD  loteprednol (LOTEMAX) 0.5 % ophthalmic suspension Place 1 drop into both eyes 4  (four) times daily.    Historical Provider, MD  metoprolol tartrate (LOPRESSOR) 25 MG tablet Take 25 mg by mouth daily.    Historical Provider, MD  pravastatin (PRAVACHOL) 20 MG tablet Take 20 mg by mouth at bedtime.    Historical Provider, MD  pregabalin (LYRICA) 150 MG capsule Take 150 mg by mouth 2 (two) times daily.    Historical Provider, MD  ranitidine (ZANTAC) 300 MG tablet Take 1 tablet (300 mg total) by mouth at bedtime. 06/04/13   [name], MD  tapentadol (NUCYNTA) 50 MG TABS tablet Take 50 mg by mouth 2 (two) times daily.     Historical Provider, MD  traMADol (ULTRAM) 50 MG tablet Take 50 mg by mouth 2 (two) times daily as needed for moderate pain.    Historical Provider, MD   BP 159/76  Pulse 81  Temp(Src) 98.8 F (37.1 C) (Oral)  Resp 20  SpO2 100% Physical Exam  Constitutional: She is oriented to person, place, and time. She appears well-developed and well-nourished. No distress.  HENT:  Head: Normocephalic and atraumatic.  Right Ear: Hearing normal.  Left Ear: Hearing normal.  Nose: Nose normal.  Mouth/Throat: Oropharynx is clear and moist and mucous membranes are normal.  Eyes: Conjunctivae and EOM are normal. Pupils are equal, round, and reactive to light.  Neck: Normal range of motion. Neck supple.  Cardiovascular: Regular rhythm, S1 normal and S2 normal.  Exam reveals no gallop and no friction rub.   No murmur heard. Pulmonary/Chest: Effort normal and breath sounds normal. No respiratory distress. She exhibits no tenderness.  Abdominal: Soft. Normal appearance and bowel sounds are normal. There is no hepatosplenomegaly. There is no tenderness. There is no rebound, no guarding, no tenderness at McBurney's point and negative Murphy's sign. No hernia.  Musculoskeletal: Normal range of motion.  Diffuse tenderness - indicates that it is tender to touch throughout the entire back, mostly soft tissue area, as well as multiple joints  No redness, swelling, warmth of the  joints.  Neurological: She is alert and oriented to person, place, and time. She has normal strength. No cranial nerve deficit or sensory deficit. Coordination normal. GCS eye subscore is 4. GCS verbal subscore is 5. GCS motor subscore is 6.  Skin: Skin is warm, dry and intact. No rash noted. No cyanosis.  Psychiatric: She has a normal mood and affect. Her speech is normal and behavior is normal. Thought content normal.    ED Course  Procedures (including critical care time) Labs Review Labs Reviewed - No data to display  Imaging Review No results found.   EKG Interpretation None      MDM   Final diagnoses:  None    Presents with chronic pain secondary to arthritis, degenerative disc disease, bursitis and  fibromyalgia. Patient has an exam that does not raise concern for infectious process (septic joint, Lyme, etc) or trauma. Patient does have followup with a pain management specialist. She indicates that she has had improvement with steroids in the past. Given Decadron followed by prednisone taper. Limited supply of Percocet.    [name], MD 10/30/13 1019

## 2013-10-30 NOTE — ED Notes (Signed)
Pt c/o ankle pain, neck pain, back pain d/t fibromyalgia, DDD, bursitis and neuropathy x 5 years.  Worse over the last week

## 2013-10-30 NOTE — Discharge Instructions (Signed)
Arthralgia Arthralgia is joint pain. A joint is a place where two bones meet. Joint pain can happen for many reasons. The joint can be bruised, stiff, infected, or weak from aging. Pain usually goes away after resting and taking medicine for soreness.  HOME CARE  Rest the joint as told by your doctor.  Keep the sore joint raised (elevated) for the first 24 hours.  Put ice on the joint area.  Put ice in a plastic bag.  Place a towel between your skin and the bag.  Leave the ice on for 15-20 minutes, 03-04 times a day.  Wear your splint, casting, elastic bandage, or sling as told by your doctor.  Only take medicine as told by your doctor. Do not take aspirin.  Use crutches as told by your doctor. Do not put weight on the joint until told to by your doctor. GET HELP RIGHT AWAY IF:   You have bruising, puffiness (swelling), or more pain.  Your fingers or toes turn blue or start to lose feeling (numb).  Your medicine does not lessen the pain.  Your pain becomes severe.  You have a temperature by mouth above 102 F (38.9 C), not controlled by medicine.  You cannot move or use the joint. MAKE SURE YOU:   Understand these instructions.  Will watch your condition.  Will get help right away if you are not doing well or get worse. Document Released: 06/04/2009 Document Revised: 09/08/2011 Document Reviewed: 06/04/2009 [facility] Patient Information 2014 [place], [place].  Fibromyalgia Fibromyalgia is a disorder that is often misunderstood. It is associated with muscular pains and tenderness that comes and goes. It is often associated with fatigue and sleep disturbances. Though it tends to be long-lasting, fibromyalgia is not life-threatening. CAUSES  The exact cause of fibromyalgia is unknown. People with certain gene types are predisposed to developing fibromyalgia and other conditions. Certain factors can play a role as triggers, such as:  Spine  disorders.  Arthritis.  Severe injury (trauma) and other physical stressors.  Emotional stressors. SYMPTOMS   The main symptom is pain and stiffness in the muscles and joints, which can vary over time.  Sleep and fatigue problems. Other related symptoms may include:  Bowel and bladder problems.  Headaches.  Visual problems.  Problems with odors and noises.  Depression or mood changes.  Painful periods (dysmenorrhea).  Dryness of the skin or eyes. DIAGNOSIS  There are no specific tests for diagnosing fibromyalgia. Patients can be diagnosed accurately from the specific symptoms they have. The diagnosis is made by determining that nothing else is causing the problems. TREATMENT  There is no cure. Management includes medicines and an active, healthy lifestyle. The goal is to enhance physical fitness, decrease pain, and improve sleep. HOME CARE INSTRUCTIONS   Only take over-the-counter or prescription medicines as directed by your caregiver. Sleeping pills, tranquilizers, and pain medicines may make your problems worse.  Low-impact aerobic exercise is very important and advised for treatment. At first, it may seem to make pain worse. Gradually increasing your tolerance will overcome this feeling.  Learning relaxation techniques and how to control stress will help you. Biofeedback, visual imagery, hypnosis, muscle relaxation, yoga, and meditation are all options.  Anti-inflammatory medicines and physical therapy may provide short-term help.  Acupuncture or massage treatments may help.  Take muscle relaxant medicines as suggested by your caregiver.  Avoid stressful situations.  Plan a healthy lifestyle. This includes your diet, sleep, rest, exercise, and friends.  Find and practice a  hobby you enjoy.  Join a fibromyalgia support group for interaction, ideas, and sharing advice. This may be helpful. SEEK MEDICAL CARE IF:  You are not having good results or improvement  from your treatment. FOR MORE INFORMATION  National Fibromyalgia Association: www.fmaware.[place]: www.arthritis.org Document Released: 06/16/2005 Document Revised: 09/08/2011 Document Reviewed: 09/26/2009 [facility] Patient Information 2014 [place], [place].

## 2013-11-16 ENCOUNTER — Telehealth: Payer: Self-pay | Admitting: Neurology

## 2013-11-16 NOTE — Telephone Encounter (Signed)
Patient wanted to let Dr. Jannifer Franklin know that her rheumatologist has diagnosed her with rheumatoid arthritis and and also degenerative osteoarthritis in her knees. Patient would like a call back from the nurse to see what can be done or where she should go from here. Please call to advise.

## 2013-11-16 NOTE — Telephone Encounter (Signed)
I called patient. The patient is being followed through a pain Center, they are planning a procedure to help her with a pain. I have recommended that she follow through with this procedure.

## 2013-11-16 NOTE — Telephone Encounter (Signed)
Spoke with patient and she wanted to inform Dr Jannifer Franklin, and to also let him know that Dr Arman Filter ([facility]) wants to do surgery on 12/01/13 to burn 6 nerves. Does he think she should have this done?

## 2013-12-19 DIAGNOSIS — F4542 Pain disorder with related psychological factors: Secondary | ICD-10-CM

## 2013-12-19 DIAGNOSIS — F410 Panic disorder [episodic paroxysmal anxiety] without agoraphobia: Secondary | ICD-10-CM

## 2013-12-19 DIAGNOSIS — R413 Other amnesia: Secondary | ICD-10-CM

## 2013-12-23 ENCOUNTER — Telehealth: Payer: Self-pay | Admitting: Neurology

## 2013-12-23 NOTE — Telephone Encounter (Signed)
I called patient. The formal neuropsychological evaluation revealed no organic dementia issue, but it was felt that the patient had a problem with focusing secondary to pain, with some underlying anxiety and depression. I discussed this with the patient.

## 2014-01-12 ENCOUNTER — Emergency Department (HOSPITAL_COMMUNITY)
Admission: EM | Admit: 2014-01-12 | Discharge: 2014-01-12 | Disposition: A | Discharge status: Home / Self Care | Payer: Medicaid Other | Attending: Emergency Medicine | Admitting: Emergency Medicine

## 2014-01-12 ENCOUNTER — Encounter (HOSPITAL_COMMUNITY): Payer: Self-pay | Admitting: Emergency Medicine

## 2014-01-12 DIAGNOSIS — M545 Low back pain, unspecified: Secondary | ICD-10-CM | POA: Diagnosis not present

## 2014-01-12 DIAGNOSIS — E669 Obesity, unspecified: Secondary | ICD-10-CM | POA: Diagnosis not present

## 2014-01-12 DIAGNOSIS — Z8742 Personal history of other diseases of the female genital tract: Secondary | ICD-10-CM | POA: Diagnosis not present

## 2014-01-12 DIAGNOSIS — M542 Cervicalgia: Secondary | ICD-10-CM | POA: Diagnosis not present

## 2014-01-12 DIAGNOSIS — E785 Hyperlipidemia, unspecified: Secondary | ICD-10-CM | POA: Insufficient documentation

## 2014-01-12 DIAGNOSIS — F329 Major depressive disorder, single episode, unspecified: Secondary | ICD-10-CM | POA: Insufficient documentation

## 2014-01-12 DIAGNOSIS — Z87891 Personal history of nicotine dependence: Secondary | ICD-10-CM | POA: Diagnosis not present

## 2014-01-12 DIAGNOSIS — G589 Mononeuropathy, unspecified: Secondary | ICD-10-CM | POA: Diagnosis not present

## 2014-01-12 DIAGNOSIS — Z791 Long term (current) use of non-steroidal anti-inflammatories (NSAID): Secondary | ICD-10-CM | POA: Diagnosis not present

## 2014-01-12 DIAGNOSIS — M129 Arthropathy, unspecified: Secondary | ICD-10-CM | POA: Diagnosis not present

## 2014-01-12 DIAGNOSIS — F3289 Other specified depressive episodes: Secondary | ICD-10-CM | POA: Insufficient documentation

## 2014-01-12 DIAGNOSIS — IMO0002 Reserved for concepts with insufficient information to code with codable children: Secondary | ICD-10-CM | POA: Diagnosis not present

## 2014-01-12 DIAGNOSIS — G8929 Other chronic pain: Secondary | ICD-10-CM | POA: Diagnosis not present

## 2014-01-12 DIAGNOSIS — I10 Essential (primary) hypertension: Secondary | ICD-10-CM | POA: Diagnosis not present

## 2014-01-12 DIAGNOSIS — M546 Pain in thoracic spine: Secondary | ICD-10-CM | POA: Diagnosis present

## 2014-01-12 DIAGNOSIS — Z79899 Other long term (current) drug therapy: Secondary | ICD-10-CM | POA: Insufficient documentation

## 2014-01-12 DIAGNOSIS — E559 Vitamin D deficiency, unspecified: Secondary | ICD-10-CM | POA: Diagnosis not present

## 2014-01-12 MED ORDER — KETOROLAC TROMETHAMINE 30 MG/ML IJ SOLN
30.0000 mg | Freq: Once | INTRAMUSCULAR | Status: AC
  Administered 2014-01-12: 30 mg via INTRAVENOUS
  Filled 2014-01-12: qty 1

## 2014-01-12 MED ORDER — OXYCODONE-ACETAMINOPHEN 5-325 MG PO TABS
1.0000 | ORAL_TABLET | Freq: Once | ORAL | Status: AC
  Administered 2014-01-12: 1 via ORAL
  Filled 2014-01-12: qty 1

## 2014-01-12 NOTE — ED Provider Notes (Signed)
Medical screening examination/treatment/procedure(s) were performed by non-physician practitioner and as supervising physician I was immediately available for consultation/collaboration.   Leota Jacobsen, MD 01/12/14 [phone]

## 2014-01-12 NOTE — ED Notes (Signed)
Per pt, states back pain that started this am

## 2014-01-12 NOTE — ED Provider Notes (Signed)
CSN: [id number]     Arrival date & time 01/12/14  1731 History  This chart was scribed for non-physician practitioner, Cherylann Parr, PA-C, working with Leota Jacobsen, MD, by Delphia Grates, ED Scribe. This patient was seen in room [place] and the patient's care was started at 7:21 PM.    Chief Complaint  Patient presents with  . Back Pain     The history is provided by the patient. No language interpreter was used.    HPI Comments: Donna Coleman is a 43 y.o. female who presents to the Emergency Department complaining of intermittent, central back pain that started this morning. Patient has history of degenerative disc disease and has history of back pain. She reports the pain starts in the center and radiates up her back. There is associated neck pain. She reports tingling in bilateral legs and bladder incontinence, but states this is baseline. She states she has been prescribed medication for urinary incontinence, but has been unable to get this filled due to complications with her Medicaid. Patient states anything can make her pain worse. Patient denies fever, chills, SOB, chest pain, nausea, or emesis. Patient has a walker and a cane, but states she is able to ambulate without assistance most of the time. She states she receives steroid injections every 3-4 months for her chronic back pain, but was unable to receive her next injection during her last visit with her PCP. Patient reports taking oral steroid (Prednisone) without improvement. She has history of arthritis, neuropathy, HTN, cervical spine disease, depression, fibromyalgia, dyslipidemia, and Vitamin D deficiency. Patient is not established with a pain management clinic.   Patient was seen here May 3rd and Dr. Betsey Holiday reports the patient had a chronic pain management specialist.   Dr. Vista Lawman is her PCP and Dr. Sherrian Divers is her orthopedists.   Past Medical History  Diagnosis Date  . Arthritis     neck, central lumbar  area  . Neuropathy   . Hypertension   . Cervical spine disease 10/2012  . RP (retinitis pigmentosa)   . Fibroid tumor in breast  . Chronic pain syndrome   . Depression   . Vitamin D deficiency   . Myalgia and myositis, unspecified 04/07/2013  . Obesity   . Headache(784.0)   . Dyslipidemia   . Fibromyalgia   . Memory deficits 08/11/2013   Past Surgical History  Procedure Laterality Date  . Tubal ligation     Family History  Problem Relation Age of Onset  . Diabetes Maternal Grandmother   . Alzheimer's disease Maternal Grandmother    History  Substance Use Topics  . Smoking status: Former Smoker -- 0.33 packs/day for 12 years    Types: Cigarettes    Quit date: 06/30/2010  . Smokeless tobacco: Never Used     Comment: 1pack per 3 days.   . Alcohol Use: No   OB History   Grav Para Term Preterm Abortions TAB SAB Ect Mult Living                 Review of Systems  Constitutional: Negative for fever and diaphoresis.  Respiratory: Negative for shortness of breath.   Cardiovascular: Negative for chest pain.  Gastrointestinal: Negative for nausea and vomiting.  Musculoskeletal: Positive for back pain and neck pain.  Neurological: Negative for numbness.  All other systems reviewed and are negative.     Allergies  Review of patient's allergies indicates no known allergies.  Home Medications   Prior to Admission  medications   Medication Sig Start Date End Date Taking? Authorizing Provider  butalbital-acetaminophen-caffeine (FIORICET) 50-325-40 MG per tablet Take 1 tablet by mouth 4 (four) times daily as needed for headache.    Historical Provider, MD  carisoprodol (SOMA) 350 MG tablet Take 350 mg by mouth 2 (two) times daily.    Historical Provider, MD  Cholecalciferol (RA VITAMIN D-3) 1000 UNITS tablet Take 1,000 Units by mouth daily.    Historical Provider, MD  clonazePAM (KLONOPIN) 1 MG tablet Take 1 mg by mouth 2 (two) times daily.    Historical Provider, MD   cyclobenzaprine (FLEXERIL) 10 MG tablet Take 10 mg by mouth as needed for muscle spasms.    Historical Provider, MD  cycloSPORINE (RESTASIS) 0.05 % ophthalmic emulsion Place 1 drop into both eyes 2 (two) times daily.    Historical Provider, MD  DULoxetine (CYMBALTA) 60 MG capsule Take 60 mg by mouth daily.    Historical Provider, MD  fentaNYL (DURAGESIC - DOSED MCG/HR) 25 MCG/HR patch Place 25 mcg onto the skin every 3 (three) days.    Historical Provider, MD  gabapentin (NEURONTIN) 300 MG capsule Take 1 capsule (300 mg total) by mouth at bedtime. 06/04/13   [name], MD  indomethacin (INDOCIN) 25 MG capsule Take 50 mg by mouth 2 (two) times daily.     Historical Provider, MD  lisinopril-hydrochlorothiazide (PRINZIDE,ZESTORETIC) 10-12.5 MG per tablet Take 1 tablet by mouth daily.    Historical Provider, MD  loteprednol (LOTEMAX) 0.5 % ophthalmic suspension Place 1 drop into both eyes 4 (four) times daily.    Historical Provider, MD  metoprolol tartrate (LOPRESSOR) 25 MG tablet Take 25 mg by mouth daily.    Historical Provider, MD  oxyCODONE-acetaminophen (PERCOCET) 5-325 MG per tablet Take 2 tablets by mouth every 4 (four) hours as needed. 10/30/13   [name], MD  pravastatin (PRAVACHOL) 20 MG tablet Take 20 mg by mouth at bedtime.    Historical Provider, MD  predniSONE (DELTASONE) 20 MG tablet 3 tabs po daily x 3 days, then 2 tabs x 3 days, then 1.5 tabs x 3 days, then 1 tab x 3 days, then 0.5 tabs x 3 days 10/30/13   [name], MD  pregabalin (LYRICA) 150 MG capsule Take 150 mg by mouth 2 (two) times daily.    Historical Provider, MD  ranitidine (ZANTAC) 300 MG tablet Take 1 tablet (300 mg total) by mouth at bedtime. 06/04/13   [name], MD  tapentadol (NUCYNTA) 50 MG TABS tablet Take 50 mg by mouth 2 (two) times daily.     Historical Provider, MD  traMADol (ULTRAM) 50 MG tablet Take 50 mg by mouth 2 (two) times daily as needed for moderate pain.    Historical  Provider, MD   Triage Vitals: BP 152/109  Pulse 105  Temp(Src) 98.7 F (37.1 C) (Oral)  Resp 18  SpO2 98%  LMP 01/09/2014  Physical Exam  Nursing note and vitals reviewed. Constitutional: She is oriented to person, place, and time. She appears well-developed and well-nourished. No distress.  HENT:  Head: Normocephalic and atraumatic.  Mouth/Throat: Oropharynx is clear and moist. No oropharyngeal exudate.  Eyes: Conjunctivae and EOM are normal. Pupils are equal, round, and reactive to light. No scleral icterus.  Neck: Normal range of motion. Neck supple. Muscular tenderness present. No spinous process tenderness present. No rigidity. No edema, no erythema and normal range of motion present.  Cardiovascular: Normal rate, regular rhythm, normal heart sounds and intact distal  pulses.  Exam reveals no gallop and no friction rub.   No murmur heard. Pulmonary/Chest: Effort normal and breath sounds normal. No respiratory distress. She has no wheezes. She has no rales. She exhibits no tenderness.  Musculoskeletal:       Thoracic back: She exhibits decreased range of motion, tenderness, bony tenderness and pain. She exhibits no swelling, no edema, no deformity, no laceration, no spasm and normal pulse.       Lumbar back: She exhibits decreased range of motion, tenderness, bony tenderness and pain. She exhibits no swelling, no edema, no deformity, no laceration, no spasm and normal pulse.  Neurological: She is alert and oriented to person, place, and time. No sensory deficit.  Skin: Skin is warm and dry.  Psychiatric: She has a normal mood and affect. Her behavior is normal. Judgment and thought content normal.    ED Course  Procedures (including critical care time)  DIAGNOSTIC STUDIES: Oxygen Saturation is 98% on room air, normal by my interpretation.    COORDINATION OF CARE: At 1939 Discussed treatment plan with patient which includes Toradol and Percocet. Patient agrees.   Labs  Review Labs Reviewed - No data to display  Imaging Review No results found.   EKG Interpretation None      MDM   Final diagnoses:  Chronic low back pain   Patient is a 43 y.o. Female who present with chronic low back pain.  Patient was previously seen in pain clinics but has been dismissed from multiple pain clinics.  Patient was treated here with IM toradol and also with percocet.  I will not discharge the patient with any pain medication at this time.  Patient does not have any cauda equina symptoms at this time.  Patient will be discharged home with referrals to [facility] for further orthopedic evaluation and will also be given a referral to [facility] at her request. She states understanding and agreement at this.    I personally performed the services described in this documentation, which was scribed in my presence. The recorded information has been reviewed and is accurate.    Cherylann Parr, PA-C 01/12/14 2025

## 2014-01-12 NOTE — Discharge Instructions (Signed)

## 2014-01-19 ENCOUNTER — Encounter (HOSPITAL_COMMUNITY): Payer: Self-pay | Admitting: Emergency Medicine

## 2014-01-19 ENCOUNTER — Emergency Department (HOSPITAL_COMMUNITY)
Admission: EM | Admit: 2014-01-19 | Discharge: 2014-01-19 | Disposition: A | Discharge status: Home / Self Care | Payer: Medicaid Other | Attending: Emergency Medicine | Admitting: Emergency Medicine

## 2014-01-19 DIAGNOSIS — E785 Hyperlipidemia, unspecified: Secondary | ICD-10-CM | POA: Insufficient documentation

## 2014-01-19 DIAGNOSIS — Z87891 Personal history of nicotine dependence: Secondary | ICD-10-CM | POA: Diagnosis not present

## 2014-01-19 DIAGNOSIS — IMO0002 Reserved for concepts with insufficient information to code with codable children: Secondary | ICD-10-CM | POA: Diagnosis not present

## 2014-01-19 DIAGNOSIS — F329 Major depressive disorder, single episode, unspecified: Secondary | ICD-10-CM | POA: Diagnosis not present

## 2014-01-19 DIAGNOSIS — E669 Obesity, unspecified: Secondary | ICD-10-CM | POA: Insufficient documentation

## 2014-01-19 DIAGNOSIS — R51 Headache: Secondary | ICD-10-CM | POA: Diagnosis not present

## 2014-01-19 DIAGNOSIS — M129 Arthropathy, unspecified: Secondary | ICD-10-CM | POA: Diagnosis not present

## 2014-01-19 DIAGNOSIS — M549 Dorsalgia, unspecified: Secondary | ICD-10-CM | POA: Diagnosis present

## 2014-01-19 DIAGNOSIS — E559 Vitamin D deficiency, unspecified: Secondary | ICD-10-CM | POA: Diagnosis not present

## 2014-01-19 DIAGNOSIS — I10 Essential (primary) hypertension: Secondary | ICD-10-CM | POA: Diagnosis not present

## 2014-01-19 DIAGNOSIS — Z79899 Other long term (current) drug therapy: Secondary | ICD-10-CM | POA: Diagnosis not present

## 2014-01-19 DIAGNOSIS — Z791 Long term (current) use of non-steroidal anti-inflammatories (NSAID): Secondary | ICD-10-CM | POA: Insufficient documentation

## 2014-01-19 DIAGNOSIS — M542 Cervicalgia: Secondary | ICD-10-CM | POA: Insufficient documentation

## 2014-01-19 DIAGNOSIS — F3289 Other specified depressive episodes: Secondary | ICD-10-CM | POA: Insufficient documentation

## 2014-01-19 DIAGNOSIS — G8929 Other chronic pain: Secondary | ICD-10-CM | POA: Insufficient documentation

## 2014-01-19 MED ORDER — MELOXICAM 15 MG PO TABS: 15.0000 mg | ORAL_TABLET | Freq: Every day | ORAL | Status: DC

## 2014-01-19 MED ORDER — KETOROLAC TROMETHAMINE 60 MG/2ML IM SOLN
60.0000 mg | Freq: Once | INTRAMUSCULAR | Status: AC
  Administered 2014-01-19: 60 mg via INTRAMUSCULAR
  Filled 2014-01-19: qty 2

## 2014-01-19 MED ORDER — OXYCODONE-ACETAMINOPHEN 5-325 MG PO TABS: 1.0000 | ORAL_TABLET | Freq: Once | ORAL | Status: DC

## 2014-01-19 NOTE — Discharge Instructions (Signed)
Take meloxicam as directed for your pain. Follow up with your primary care doctor and one of the resources below for chronic pain.   Chronic Pain Chronic pain can be defined as pain that is off and on and lasts for 3-6 months or longer. Many things cause chronic pain, which can make it difficult to make a diagnosis. There are many treatment options available for chronic pain. However, finding a treatment that works well for you may require trying various approaches until the right one is found. Many people benefit from a combination of two or more types of treatment to control their pain. SYMPTOMS  Chronic pain can occur anywhere in the body and can range from mild to very severe. Some types of chronic pain include:  Headache.  Low back pain.  Cancer pain.  Arthritis pain.  Neurogenic pain. This is pain resulting from damage to nerves. People with chronic pain may also have other symptoms such as:  Depression.  Anger.  Insomnia.  Anxiety. DIAGNOSIS  Your health care provider will help diagnose your condition over time. In many cases, the initial focus will be on excluding possible conditions that could be causing the pain. Depending on your symptoms, your health care provider may order tests to diagnose your condition. Some of these tests may include:   Blood tests.   CT scan.   MRI.   X-rays.   Ultrasounds.   Nerve conduction studies.  You may need to see a specialist.  TREATMENT  Finding treatment that works well may take time. You may be referred to a pain specialist. He or she may prescribe medicine or therapies, such as:   Mindful meditation or yoga.  Shots (injections) of numbing or pain-relieving medicines into the spine or area of pain.  Local electrical stimulation.  Acupuncture.   Massage therapy.   Aroma, color, light, or sound therapy.   Biofeedback.   Working with a physical therapist to keep from getting stiff.   Regular, gentle  exercise.   Cognitive or behavioral therapy.   Group support.  Sometimes, surgery may be recommended.  HOME CARE INSTRUCTIONS   Take all medicines as directed by your health care provider.   Lessen stress in your life by relaxing and doing things such as listening to calming music.   Exercise or be active as directed by your health care provider.   Eat a healthy diet and include things such as vegetables, fruits, fish, and lean meats in your diet.   Keep all follow-up appointments with your health care provider.   Attend a support group with others suffering from chronic pain. SEEK MEDICAL CARE IF:   Your pain gets worse.   You develop a new pain that was not there before.   You cannot tolerate medicines given to you by your health care provider.   You have new symptoms since your last visit with your health care provider.  SEEK IMMEDIATE MEDICAL CARE IF:   You feel weak.   You have decreased sensation or numbness.   You lose control of bowel or bladder function.   Your pain suddenly gets much worse.   You develop shaking.  You develop chills.  You develop confusion.  You develop chest pain.  You develop shortness of breath.  MAKE SURE YOU:  Understand these instructions.  Will watch your condition.  Will get help right away if you are not doing well or get worse. Document Released: 03/08/2002 Document Revised: 02/16/2013 Document Reviewed: 12/10/2012 ExitCare Patient  Information 2015 [place], [place]. This information is not intended to replace advice given to you by your health care provider. Make sure you discuss any questions you have with your health care provider.  RESOURCE GUIDE  Chronic Pain Problems: Contact [facility]  [phone] Patients need to be referred by their primary care doctor.

## 2014-01-19 NOTE — ED Notes (Signed)
Pt reports she has chronic pain syndrome and DDD. Pain has increased since Saturday in her neck and back. Pt reports she is out of her home medications. Denies injury or urinary sx. Pt sts she does urinate on herself but that is normal for her and she has had multiple MRI's.

## 2014-01-19 NOTE — ED Provider Notes (Signed)
CSN: [id number]     Arrival date & time 01/19/14  1502 History  This chart was scribed for non-physician practitioner [name], working with No att. providers found by Donato Schultz, ED Scribe. This patient was seen in room TR05C/TR05C and the patient's care was started at 3:27 PM.    Chief Complaint  Patient presents with  . Back Pain   Patient is a 43 y.o. female presenting with back pain. The history is provided by the patient. No language interpreter was used.  Back Pain Associated symptoms: no dysuria    HPI Comments: Donna Coleman is a 43 y.o. female with a history of chronic pain syndrome and degenerative disc disease who presents to the Emergency Department complaining of constant, severe back and neck pain that has been ongoing for years but worsened over the past five days.  The patient denies any recent injury.  She denies any new numbness, tingling, and loss of bladder or bowel function as associated symptoms.  She states that she does not normally take medications for her pain.  She states that she is taking carisoprodol to treat severe muscle spasms.The patient's PCP is OSEI-BONSU,GEORGE, MD.  She states that she went to see [name] in the past and was told that there was nothing they could do for her.  She is requesting hydrocodone as she got from the ED in the past which helped her pain.   Past Medical History  Diagnosis Date  . Arthritis     neck, central lumbar area  . Neuropathy   . Hypertension   . Cervical spine disease 10/2012  . RP (retinitis pigmentosa)   . Fibroid tumor in breast  . Chronic pain syndrome   . Depression   . Vitamin D deficiency   . Myalgia and myositis, unspecified 04/07/2013  . Obesity   . Headache(784.0)   . Dyslipidemia   . Fibromyalgia   . Memory deficits 08/11/2013   Past Surgical History  Procedure Laterality Date  . Tubal ligation     Family History  Problem Relation Age of Onset  . Diabetes Maternal Grandmother   .  Alzheimer's disease Maternal Grandmother    History  Substance Use Topics  . Smoking status: Former Smoker -- 0.33 packs/day for 12 years    Types: Cigarettes    Quit date: 06/30/2010  . Smokeless tobacco: Never Used     Comment: 1pack per 3 days.   . Alcohol Use: No   OB History   Grav Para Term Preterm Abortions TAB SAB Ect Mult Living                 Review of Systems  Gastrointestinal: Negative for diarrhea and constipation.  Endocrine: Negative for polyuria.  Genitourinary: Negative for dysuria, frequency, hematuria, decreased urine volume, enuresis, difficulty urinating and dyspareunia.  Musculoskeletal: Positive for arthralgias, back pain and neck pain. Negative for gait problem, joint swelling, myalgias and neck stiffness.  All other systems reviewed and are negative.     Allergies  Review of patient's allergies indicates no known allergies.  Home Medications   Prior to Admission medications   Medication Sig Start Date End Date Taking? Authorizing Provider  butalbital-acetaminophen-caffeine (FIORICET) 50-325-40 MG per tablet Take 1 tablet by mouth 4 (four) times daily as needed for headache.    Historical Provider, MD  carisoprodol (SOMA) 350 MG tablet Take 350 mg by mouth 2 (two) times daily.    Historical Provider, MD  Cholecalciferol (RA VITAMIN D-3) 1000  UNITS tablet Take 1,000 Units by mouth daily.    Historical Provider, MD  clonazePAM (KLONOPIN) 1 MG tablet Take 1 mg by mouth 2 (two) times daily.    Historical Provider, MD  cyclobenzaprine (FLEXERIL) 10 MG tablet Take 10 mg by mouth as needed for muscle spasms.    Historical Provider, MD  cycloSPORINE (RESTASIS) 0.05 % ophthalmic emulsion Place 1 drop into both eyes 2 (two) times daily.    Historical Provider, MD  DULoxetine (CYMBALTA) 60 MG capsule Take 60 mg by mouth daily.    Historical Provider, MD  fentaNYL (DURAGESIC - DOSED MCG/HR) 25 MCG/HR patch Place 25 mcg onto the skin every 3 (three) days.     Historical Provider, MD  gabapentin (NEURONTIN) 300 MG capsule Take 1 capsule (300 mg total) by mouth at bedtime. 06/04/13   [name], MD  indomethacin (INDOCIN) 25 MG capsule Take 50 mg by mouth 2 (two) times daily.     Historical Provider, MD  lisinopril-hydrochlorothiazide (PRINZIDE,ZESTORETIC) 10-12.5 MG per tablet Take 1 tablet by mouth daily.    Historical Provider, MD  loteprednol (LOTEMAX) 0.5 % ophthalmic suspension Place 1 drop into both eyes 4 (four) times daily.    Historical Provider, MD  meloxicam (MOBIC) 15 MG tablet Take 1 tablet (15 mg total) by mouth daily. With food 01/19/14   [name], PA-C  metoprolol tartrate (LOPRESSOR) 25 MG tablet Take 25 mg by mouth daily.    Historical Provider, MD  oxyCODONE-acetaminophen (PERCOCET) 5-325 MG per tablet Take 2 tablets by mouth every 4 (four) hours as needed. 10/30/13   [name], MD  pravastatin (PRAVACHOL) 20 MG tablet Take 20 mg by mouth at bedtime.    Historical Provider, MD  predniSONE (DELTASONE) 20 MG tablet 3 tabs po daily x 3 days, then 2 tabs x 3 days, then 1.5 tabs x 3 days, then 1 tab x 3 days, then 0.5 tabs x 3 days 10/30/13   [name], MD  pregabalin (LYRICA) 150 MG capsule Take 150 mg by mouth 2 (two) times daily.    Historical Provider, MD  ranitidine (ZANTAC) 300 MG tablet Take 1 tablet (300 mg total) by mouth at bedtime. 06/04/13   [name], MD  tapentadol (NUCYNTA) 50 MG TABS tablet Take 50 mg by mouth 2 (two) times daily.     Historical Provider, MD  traMADol (ULTRAM) 50 MG tablet Take 50 mg by mouth 2 (two) times daily as needed for moderate pain.    Historical Provider, MD   Triage Vitals: BP 155/88  Pulse 92  Temp(Src) 98.1 F (36.7 C) (Oral)  Resp 16  Ht 5\' 2"  (1.575 m)  Wt 210 lb (95.255 kg)  BMI 38.40 kg/m2  SpO2 94%  LMP 01/09/2014  Physical Exam  Nursing note and vitals reviewed. Constitutional: She is oriented to person, place, and time. She appears well-developed  and well-nourished. No distress.  HENT:  Head: Normocephalic and atraumatic.  Mouth/Throat: Oropharynx is clear and moist.  Eyes: Conjunctivae are normal.  Neck: Normal range of motion. Neck supple. No spinous process tenderness and no muscular tenderness present.  Cardiovascular: Normal rate, regular rhythm and normal heart sounds.   Pulmonary/Chest: Effort normal and breath sounds normal. No respiratory distress.  Musculoskeletal: She exhibits no edema.  Tender anywhere you touch on her body.   Neurological: She is alert and oriented to person, place, and time. She has normal strength.  Strength lower extremities 5/5 and equal bilateral. Sensation intact. Normal  gait.  Skin: Skin is warm and dry. No rash noted. She is not diaphoretic.  Psychiatric: She has a normal mood and affect. Her behavior is normal.    ED Course  Procedures (including critical care time)  DIAGNOSTIC STUDIES: Oxygen Saturation is 94% on room air, adequate by my interpretation.    COORDINATION OF CARE: 3:31 PM- Discussed discharging the patient with Meloxicam.  Advised the patient to follow-up with her PCP.  The patient agreed to the treatment plan.   Labs Review Labs Reviewed - No data to display  Imaging Review No results found.   EKG Interpretation None      MDM   Final diagnoses:  Chronic pain   Pt with exacerbation of chronic pain. AFVSS. Advised her to f/u with her PCP regarding chronic pain for possible referral to pain management. I do not feel narcotic medication is appropriate at this time as she can discuss this with her PCP. Will d/c with meloxicam. Stable for d/c. Return precautions given. Patient states understanding of treatment care plan and is agreeable.  I personally performed the services described in this documentation, which was scribed in my presence. The recorded information has been reviewed and is accurate.  [name], PA-C 01/20/14 [phone]

## 2014-02-02 NOTE — ED Provider Notes (Signed)
Medical screening examination/treatment/procedure(s) were performed by non-physician practitioner and as supervising physician I was immediately available for consultation/collaboration.   EKG Interpretation None       Fredia Sorrow, MD 02/02/14 1232

## 2014-02-02 NOTE — Telephone Encounter (Signed)
Noted  

## 2014-02-09 ENCOUNTER — Ambulatory Visit: Payer: Self-pay | Admitting: Obstetrics

## 2014-02-23 ENCOUNTER — Ambulatory Visit: Payer: Self-pay | Admitting: Obstetrics

## 2014-03-01 ENCOUNTER — Telehealth: Payer: Self-pay | Admitting: *Deleted

## 2014-03-01 NOTE — Telephone Encounter (Signed)
I confirmed appt with [facility] for 03-08-14 at 0900 with Dr. Jannifer Franklin.

## 2014-03-08 ENCOUNTER — Encounter: Payer: Self-pay | Admitting: Neurology

## 2014-03-08 ENCOUNTER — Ambulatory Visit (INDEPENDENT_AMBULATORY_CARE_PROVIDER_SITE_OTHER): Payer: Medicaid Other | Admitting: Neurology

## 2014-03-08 VITALS — BP 145/95 | HR 78 | Wt 221.0 lb

## 2014-03-08 DIAGNOSIS — IMO0001 Reserved for inherently not codable concepts without codable children: Secondary | ICD-10-CM

## 2014-03-08 DIAGNOSIS — R413 Other amnesia: Secondary | ICD-10-CM

## 2014-03-08 NOTE — Progress Notes (Signed)
Reason for visit: Fibromyalgia  Donna Coleman is an 43 y.o. female  History of present illness:  Donna Coleman is a 43 year old right-handed black female with a history of diffuse pain, fibromyalgia. The patient reports a memory disturbance. The patient has undergone a formal neuropsychological evaluation, and no evidence of an organic dementia was found. The patient was felt to have a lot of overlying anxiety and depression. The patient has a diffusely positive review of systems, and a lot of somatic complaints. The patient has dramatic pain displays, and she currently is followed through the [facility], and she is planning on epidural injections of the neck and low back. The patient is on Gralise taking 1200 mg daily, and she is on amitriptyline, she believes at 25 mg at night. The patient is on a low-dose fentanyl patch daily. She returns for an evaluation.  Past Medical History  Diagnosis Date  . Arthritis     neck, central lumbar area  . Neuropathy   . Hypertension   . Cervical spine disease 10/2012  . RP (retinitis pigmentosa)   . Fibroid tumor in breast  . Chronic pain syndrome   . Depression   . Vitamin D deficiency   . Myalgia and myositis, unspecified 04/07/2013  . Obesity   . Headache(784.0)   . Dyslipidemia   . Fibromyalgia   . Memory deficits 08/11/2013    Past Surgical History  Procedure Laterality Date  . Tubal ligation      Family History  Problem Relation Age of Onset  . Diabetes Maternal Grandmother   . Alzheimer's disease Maternal Grandmother     Social history:  reports that she quit smoking about 3 years ago. Her smoking use included Cigarettes. She has a 3.96 pack-year smoking history. She has never used smokeless tobacco. She reports that she does not drink alcohol or use illicit drugs.   No Known Allergies  Medications:  Current Outpatient Prescriptions on File Prior to Visit  Medication Sig Dispense Refill  .  butalbital-acetaminophen-caffeine (FIORICET) 50-325-40 MG per tablet Take 1 tablet by mouth 4 (four) times daily as needed for headache.      . carisoprodol (SOMA) 350 MG tablet Take 350 mg by mouth 2 (two) times daily.      . Cholecalciferol (RA VITAMIN D-3) 1000 UNITS tablet Take 1,000 Units by mouth daily.      . cycloSPORINE (RESTASIS) 0.05 % ophthalmic emulsion Place 1 drop into both eyes 2 (two) times daily.      . fentaNYL (DURAGESIC - DOSED MCG/HR) 25 MCG/HR patch Place 25 mcg onto the skin every 3 (three) days.      Marland Kitchen lisinopril-hydrochlorothiazide (PRINZIDE,ZESTORETIC) 10-12.5 MG per tablet Take 1 tablet by mouth daily.      Marland Kitchen loteprednol (LOTEMAX) 0.5 % ophthalmic suspension Place 1 drop into both eyes 4 (four) times daily.      . metoprolol tartrate (LOPRESSOR) 25 MG tablet Take 25 mg by mouth daily.      Marland Kitchen oxyCODONE-acetaminophen (PERCOCET) 5-325 MG per tablet Take 2 tablets by mouth every 4 (four) hours as needed.  10 tablet  0  . pravastatin (PRAVACHOL) 20 MG tablet Take 20 mg by mouth at bedtime.      . predniSONE (DELTASONE) 20 MG tablet 3 tabs po daily x 3 days, then 2 tabs x 3 days, then 1.5 tabs x 3 days, then 1 tab x 3 days, then 0.5 tabs x 3 days  27 tablet  0  .  ranitidine (ZANTAC) 300 MG tablet Take 1 tablet (300 mg total) by mouth at bedtime.  30 tablet  3   No current facility-administered medications on file prior to visit.    ROS:  Out of a complete 14 system review of symptoms, the patient complains only of the following symptoms, and all other reviewed systems are negative.  Chills, fatigue, fever, weight gain Light sensitivity, blurred vision Shortness of breath, chest tightness Chest pain, leg swelling, palpitations, heart murmur Diarrhea, nausea Restless legs, insomnia, apnea, frequent waking, daytime sleepiness, snoring Incontinence of bladder, frequency of urination Joint pain, joint swelling, back pain, achy muscles, muscle cramps, walking difficulties,  neck pain, neck stiffness Skin rash, itching Memory loss, dizziness, headache, numbness, speech difficulty, weakness, tremors Agitation, behavior problems, confusion, decreased concentration, depression, anxiety, and hyperactivity  Blood pressure 145/95, pulse 78, weight 221 lb (100.245 kg).  Physical Exam  General: The patient is alert and cooperative at the time of the examination. The patient is markedly obese.  Skin: No significant peripheral edema is noted.   Neurologic Exam  Mental status: The Mini-Mental status examination done today shows a total score of 26/30.  Cranial nerves: Facial symmetry is present. Speech is normal, no aphasia or dysarthria is noted. Extraocular movements are full. Visual fields are full to threat. The patient has prominent eye flutter during the ophthalmologic evaluation.  Motor: The patient has good strength in all 4 extremities.  Sensory examination: Soft touch sensation is symmetric on the face, arms, and legs. The patient has dramatic pain displays with any light touch sensation throughout the entire body.  Coordination: The patient has good finger-nose-finger and heel-to-shin bilaterally.  Gait and station: The patient walks with a cane. Tandem gait was not attempted. Romberg is positive, the patient falls backwards. No drift is seen.  Reflexes: Deep tendon reflexes are symmetric.   Assessment/Plan:  One. History of fibromyalgia  2. Reported memory disorder  3. Dramatic pain displays, functional examination  Our office currently is not giving this patient any medications. She continues to have quite dramatic pain displays, most of the current complaints are likely psychologically based, nonorganic. I feel that I have very little to offer this patient at this point. She will return to this office on an as-needed basis. The patient does not appear to have an organic memory issue. I would strongly recommend withdrawal of all controlled  substances currently used for this patient.  Jill Alexanders MD 03/08/2014 11:24 AM  [facility] [address] [place], [place] [address]  Phone [phone] Fax [phone]

## 2014-03-08 NOTE — Patient Instructions (Signed)
Fibromyalgia Fibromyalgia is a disorder that is often misunderstood. It is associated with muscular pains and tenderness that comes and goes. It is often associated with fatigue and sleep disturbances. Though it tends to be long-lasting, fibromyalgia is not life-threatening. CAUSES  The exact cause of fibromyalgia is unknown. People with certain gene types are predisposed to developing fibromyalgia and other conditions. Certain factors can play a role as triggers, such as:  Spine disorders.  Arthritis.  Severe injury (trauma) and other physical stressors.  Emotional stressors. SYMPTOMS   The main symptom is pain and stiffness in the muscles and joints, which can vary over time.  Sleep and fatigue problems. Other related symptoms may include:  Bowel and bladder problems.  Headaches.  Visual problems.  Problems with odors and noises.  Depression or mood changes.  Painful periods (dysmenorrhea).  Dryness of the skin or eyes. DIAGNOSIS  There are no specific tests for diagnosing fibromyalgia. Patients can be diagnosed accurately from the specific symptoms they have. The diagnosis is made by determining that nothing else is causing the problems. TREATMENT  There is no cure. Management includes medicines and an active, healthy lifestyle. The goal is to enhance physical fitness, decrease pain, and improve sleep. HOME CARE INSTRUCTIONS   Only take over-the-counter or prescription medicines as directed by your caregiver. Sleeping pills, tranquilizers, and pain medicines may make your problems worse.  Low-impact aerobic exercise is very important and advised for treatment. At first, it may seem to make pain worse. Gradually increasing your tolerance will overcome this feeling.  Learning relaxation techniques and how to control stress will help you. Biofeedback, visual imagery, hypnosis, muscle relaxation, yoga, and meditation are all options.  Anti-inflammatory medicines and  physical therapy may provide short-term help.  Acupuncture or massage treatments may help.  Take muscle relaxant medicines as suggested by your caregiver.  Avoid stressful situations.  Plan a healthy lifestyle. This includes your diet, sleep, rest, exercise, and friends.  Find and practice a hobby you enjoy.  Join a fibromyalgia support group for interaction, ideas, and sharing advice. This may be helpful. SEEK MEDICAL CARE IF:  You are not having good results or improvement from your treatment. FOR MORE INFORMATION  National Fibromyalgia Association: www.fmaware.org Arthritis Foundation: www.arthritis.org Document Released: 06/16/2005 Document Revised: 09/08/2011 Document Reviewed: 09/26/2009 [employer] Patient Information 2015 [employer]. This information is not intended to replace advice given to you by your health care provider. Make sure you discuss any questions you have with your health care provider.  

## 2014-05-07 ENCOUNTER — Encounter (HOSPITAL_COMMUNITY): Payer: Self-pay | Admitting: Emergency Medicine

## 2014-05-07 ENCOUNTER — Emergency Department (HOSPITAL_COMMUNITY): Payer: Medicaid Other

## 2014-05-07 ENCOUNTER — Emergency Department (HOSPITAL_COMMUNITY)
Admission: EM | Admit: 2014-05-07 | Discharge: 2014-05-07 | Disposition: A | Discharge status: Home / Self Care | Payer: Medicaid Other | Attending: Emergency Medicine | Admitting: Emergency Medicine

## 2014-05-07 DIAGNOSIS — I10 Essential (primary) hypertension: Secondary | ICD-10-CM | POA: Diagnosis not present

## 2014-05-07 DIAGNOSIS — M1990 Unspecified osteoarthritis, unspecified site: Secondary | ICD-10-CM | POA: Diagnosis not present

## 2014-05-07 DIAGNOSIS — M549 Dorsalgia, unspecified: Secondary | ICD-10-CM | POA: Insufficient documentation

## 2014-05-07 DIAGNOSIS — Z87891 Personal history of nicotine dependence: Secondary | ICD-10-CM | POA: Insufficient documentation

## 2014-05-07 DIAGNOSIS — Z791 Long term (current) use of non-steroidal anti-inflammatories (NSAID): Secondary | ICD-10-CM | POA: Insufficient documentation

## 2014-05-07 DIAGNOSIS — G629 Polyneuropathy, unspecified: Secondary | ICD-10-CM | POA: Diagnosis not present

## 2014-05-07 DIAGNOSIS — E559 Vitamin D deficiency, unspecified: Secondary | ICD-10-CM | POA: Insufficient documentation

## 2014-05-07 DIAGNOSIS — R52 Pain, unspecified: Secondary | ICD-10-CM

## 2014-05-07 DIAGNOSIS — G8929 Other chronic pain: Secondary | ICD-10-CM | POA: Diagnosis not present

## 2014-05-07 DIAGNOSIS — Z79899 Other long term (current) drug therapy: Secondary | ICD-10-CM | POA: Insufficient documentation

## 2014-05-07 DIAGNOSIS — Z7952 Long term (current) use of systemic steroids: Secondary | ICD-10-CM | POA: Insufficient documentation

## 2014-05-07 DIAGNOSIS — E785 Hyperlipidemia, unspecified: Secondary | ICD-10-CM | POA: Diagnosis not present

## 2014-05-07 DIAGNOSIS — M542 Cervicalgia: Secondary | ICD-10-CM | POA: Diagnosis not present

## 2014-05-07 DIAGNOSIS — Z793 Long term (current) use of hormonal contraceptives: Secondary | ICD-10-CM | POA: Diagnosis not present

## 2014-05-07 DIAGNOSIS — F329 Major depressive disorder, single episode, unspecified: Secondary | ICD-10-CM | POA: Diagnosis not present

## 2014-05-07 DIAGNOSIS — Z8742 Personal history of other diseases of the female genital tract: Secondary | ICD-10-CM | POA: Insufficient documentation

## 2014-05-07 DIAGNOSIS — E669 Obesity, unspecified: Secondary | ICD-10-CM | POA: Insufficient documentation

## 2014-05-07 HISTORY — DX: Unspecified osteoarthritis, unspecified site: M19.90

## 2014-05-07 MED ORDER — OXYCODONE-ACETAMINOPHEN 5-325 MG PO TABS: 1.0000 | ORAL_TABLET | Freq: Four times a day (QID) | ORAL | Status: DC | PRN

## 2014-05-07 MED ORDER — OXYCODONE-ACETAMINOPHEN 5-325 MG PO TABS
1.0000 | ORAL_TABLET | Freq: Once | ORAL | Status: AC
  Administered 2014-05-07: 1 via ORAL
  Filled 2014-05-07: qty 1

## 2014-05-07 MED ORDER — DEXAMETHASONE SODIUM PHOSPHATE 10 MG/ML IJ SOLN
10.0000 mg | Freq: Once | INTRAMUSCULAR | Status: AC
  Administered 2014-05-07: 10 mg via INTRAMUSCULAR
  Filled 2014-05-07: qty 1

## 2014-05-07 MED ORDER — ONDANSETRON 4 MG PO TBDP
4.0000 mg | ORAL_TABLET | Freq: Once | ORAL | Status: AC
  Administered 2014-05-07: 4 mg via ORAL
  Filled 2014-05-07: qty 1

## 2014-05-07 MED ORDER — LIDOCAINE 5 % EX PTCH: 1.0000 | MEDICATED_PATCH | CUTANEOUS | Status: DC

## 2014-05-07 NOTE — ED Notes (Signed)
Patient transported to X-ray 

## 2014-05-07 NOTE — ED Notes (Signed)
PA at bedside.

## 2014-05-07 NOTE — Discharge Instructions (Signed)
Chronic Back Pain  When back pain lasts longer than 3 months, it is called chronic back pain.People with chronic back pain often go through certain periods that are more intense (flare-ups).  CAUSES Chronic back pain can be caused by wear and tear (degeneration) on different structures in your back. These structures include:  The bones of your spine (vertebrae) and the joints surrounding your spinal cord and nerve roots (facets).  The strong, fibrous tissues that connect your vertebrae (ligaments). Degeneration of these structures may result in pressure on your nerves. This can lead to constant pain. HOME CARE INSTRUCTIONS  Avoid bending, heavy lifting, prolonged sitting, and activities which make the problem worse.  Take brief periods of rest throughout the day to reduce your pain. Lying down or standing usually is better than sitting while you are resting.  Take over-the-counter or prescription medicines only as directed by your caregiver. SEEK IMMEDIATE MEDICAL CARE IF:   You have weakness or numbness in one of your legs or feet.  You have trouble controlling your bladder or bowels.  You have nausea, vomiting, abdominal pain, shortness of breath, or fainting. Document Released: 07/24/2004 Document Revised: 09/08/2011 Document Reviewed: 05/31/2011 [facility] Patient Information 2015 [place], [place]. This information is not intended to replace advice given to you by your health care provider. Make sure you discuss any questions you have with your health care provider.  Chronic Pain Chronic pain can be defined as pain that is off and on and lasts for 3-6 months or longer. Many things cause chronic pain, which can make it difficult to make a diagnosis. There are many treatment options available for chronic pain. However, finding a treatment that works well for you may require trying various approaches until the right one is found. Many people benefit from a combination of two or more types  of treatment to control their pain. SYMPTOMS  Chronic pain can occur anywhere in the body and can range from mild to very severe. Some types of chronic pain include:  Headache.  Low back pain.  Cancer pain.  Arthritis pain.  Neurogenic pain. This is pain resulting from damage to nerves. People with chronic pain may also have other symptoms such as:  Depression.  Anger.  Insomnia.  Anxiety. DIAGNOSIS  Your health care provider will help diagnose your condition over time. In many cases, the initial focus will be on excluding possible conditions that could be causing the pain. Depending on your symptoms, your health care provider may order tests to diagnose your condition. Some of these tests may include:   Blood tests.   CT scan.   MRI.   X-rays.   Ultrasounds.   Nerve conduction studies.  You may need to see a specialist.  TREATMENT  Finding treatment that works well may take time. You may be referred to a pain specialist. He or she may prescribe medicine or therapies, such as:   Mindful meditation or yoga.  Shots (injections) of numbing or pain-relieving medicines into the spine or area of pain.  Local electrical stimulation.  Acupuncture.   Massage therapy.   Aroma, color, light, or sound therapy.   Biofeedback.   Working with a physical therapist to keep from getting stiff.   Regular, gentle exercise.   Cognitive or behavioral therapy.   Group support.  Sometimes, surgery may be recommended.  HOME CARE INSTRUCTIONS   Take all medicines as directed by your health care provider.   Lessen stress in your life by relaxing and doing  things such as listening to calming music.   Exercise or be active as directed by your health care provider.   Eat a healthy diet and include things such as vegetables, fruits, fish, and lean meats in your diet.   Keep all follow-up appointments with your health care provider.   Attend a support  group with others suffering from chronic pain. SEEK MEDICAL CARE IF:   Your pain gets worse.   You develop a new pain that was not there before.   You cannot tolerate medicines given to you by your health care provider.   You have new symptoms since your last visit with your health care provider.  SEEK IMMEDIATE MEDICAL CARE IF:   You feel weak.   You have decreased sensation or numbness.   You lose control of bowel or bladder function.   Your pain suddenly gets much worse.   You develop shaking.  You develop chills.  You develop confusion.  You develop chest pain.  You develop shortness of breath.  MAKE SURE YOU:  Understand these instructions.  Will watch your condition.  Will get help right away if you are not doing well or get worse. Document Released: 03/08/2002 Document Revised: 02/16/2013 Document Reviewed: 12/10/2012 [facility] Patient Information 2015 [place], [place]. This information is not intended to replace advice given to you by your health care provider. Make sure you discuss any questions you have with your health care provider.

## 2014-05-07 NOTE — ED Provider Notes (Signed)
CSN: [id number]     Arrival date & time 05/07/14  1139 History  This chart was scribed for Delos Haring, PA-C, working with Ernestina Patches, MD by Steva Colder, ED Scribe. The patient was seen in room WTR5/WTR5 at 12:28 PM.     Chief Complaint  Patient presents with  . Back Pain  . Leg Pain  . Neck Pain    The history is provided by the patient. No language interpreter was used.   HPI Comments: Donna Coleman is a 43 y.o. female with a medical hx of osteoarthritis, fibromyalgia, and DDD who presents to the Emergency Department complaining of back pain. She states that she had an epidural 3 weeks ago for DDD. She states that she had 5 needles in her neck and while in her back and she woke up during the procedure. She states that the pain is worse after the procedure. She states that she was informed that she had a rheumatoid fever. No fever here. She states that her pain is this bad all the time. She states that she slipped in the bath tub last night.   She states that she is having associated symptoms of leg pain, neck pain, numbness in the right leg. She states that her pain is all through her back and it radiates to her neck and legs. She states that her last MRI was 1 year ago. She states that she ran out of her meds 6-7 days ago. She states that she was with pain management and she hasn't been with them for months. She states that she has tried Lidoderm cream with no relief for her symptoms. She denies fatigue and any other associated symptoms. She states that her procedure was done by [facility]. She states that she was referred there by Dr. Jannifer Franklin her neurologist.   Past Medical History  Diagnosis Date  . Arthritis     neck, central lumbar area  . Neuropathy   . Hypertension   . Cervical spine disease 10/2012  . RP (retinitis pigmentosa)   . Fibroid tumor in breast  . Chronic pain syndrome   . Depression   . Vitamin D deficiency   . Myalgia and myositis, unspecified  04/07/2013  . Obesity   . Headache(784.0)   . Dyslipidemia   . Fibromyalgia   . Memory deficits 08/11/2013  . Osteoarthritis    Past Surgical History  Procedure Laterality Date  . Tubal ligation     Family History  Problem Relation Age of Onset  . Diabetes Maternal Grandmother   . Alzheimer's disease Maternal Grandmother    History  Substance Use Topics  . Smoking status: Former Smoker -- 0.33 packs/day for 12 years    Types: Cigarettes    Quit date: 06/30/2010  . Smokeless tobacco: Never Used     Comment: 1pack per 3 days.   . Alcohol Use: No   OB History    No data available     Review of Systems  Constitutional: Negative for fever.  Musculoskeletal: Positive for back pain and neck pain.  Neurological: Positive for numbness.  All other systems reviewed and are negative.   Allergies  Review of patient's allergies indicates no known allergies.  Home Medications   Prior to Admission medications   Medication Sig Start Date End Date Taking? Authorizing Provider  ALPRAZolam Duanne Moron) 1 MG tablet Take 1 mg by mouth at bedtime.    Historical Provider, MD  amitriptyline (ELAVIL) 25 MG tablet Take 25 mg  by mouth at bedtime.    Historical Provider, MD  butalbital-acetaminophen-caffeine (FIORICET) 50-325-40 MG per tablet Take 1 tablet by mouth 4 (four) times daily as needed for headache.    Historical Provider, MD  carisoprodol (SOMA) 350 MG tablet Take 350 mg by mouth 2 (two) times daily.    Historical Provider, MD  Cholecalciferol (RA VITAMIN D-3) 1000 UNITS tablet Take 1,000 Units by mouth daily.    Historical Provider, MD  cycloSPORINE (RESTASIS) 0.05 % ophthalmic emulsion Place 1 drop into both eyes 2 (two) times daily.    Historical Provider, MD  diclofenac (VOLTAREN) 50 MG EC tablet Take 50 mg by mouth daily.    Historical Provider, MD  fentaNYL (DURAGESIC - DOSED MCG/HR) 25 MCG/HR patch Place 25 mcg onto the skin every 3 (three) days.    Historical Provider, MD   Gabapentin, PHN, (GRALISE) 600 MG TABS Take 1,200 mg by mouth daily after supper.    Historical Provider, MD  lidocaine (LIDODERM) 5 % Place 1 patch onto the skin daily. Remove & Discard patch within 12 hours or as directed by MD 05/07/14   Linus Mako, PA-C  lisinopril-hydrochlorothiazide (PRINZIDE,ZESTORETIC) 10-12.5 MG per tablet Take 1 tablet by mouth daily.    Historical Provider, MD  loteprednol (LOTEMAX) 0.5 % ophthalmic suspension Place 1 drop into both eyes 4 (four) times daily.    Historical Provider, MD  metoprolol tartrate (LOPRESSOR) 25 MG tablet Take 25 mg by mouth daily.    Historical Provider, MD  Norgestimate-Ethinyl Estradiol Triphasic 0.18/0.215/0.25 MG-25 MCG tab Take 1 tablet by mouth daily.    Historical Provider, MD  oxyCODONE-acetaminophen (PERCOCET/ROXICET) 5-325 MG per tablet Take 1 tablet by mouth every 6 (six) hours as needed for severe pain. 05/07/14   Maykayla Highley Marilu Favre, PA-C  pravastatin (PRAVACHOL) 20 MG tablet Take 20 mg by mouth at bedtime.    Historical Provider, MD  predniSONE (DELTASONE) 20 MG tablet 3 tabs po daily x 3 days, then 2 tabs x 3 days, then 1.5 tabs x 3 days, then 1 tab x 3 days, then 0.5 tabs x 3 days 10/30/13   [name], MD  ranitidine (ZANTAC) 300 MG tablet Take 1 tablet (300 mg total) by mouth at bedtime. 06/04/13   [name], MD  tiZANidine (ZANAFLEX) 4 MG tablet Take 4 mg by mouth every 8 (eight) hours.    Historical Provider, MD   BP 148/97 mmHg  Pulse 84  Temp(Src) 98.3 F (36.8 C) (Oral)  Resp 20  SpO2 97%  Physical Exam  Constitutional: She is oriented to person, place, and time. She appears well-developed and well-nourished. No distress.  HENT:  Head: Normocephalic and atraumatic.  Eyes: EOM are normal.  Neck: Neck supple. No tracheal deviation present.  Cardiovascular: Normal rate.   Pulmonary/Chest: Effort normal. No respiratory distress.  Musculoskeletal: Normal range of motion.       Back:  Pt has equal  strength to bilateral lower extremities.  Neurosensory function adequate to both legs No clonus on dorsiflextion Skin color is normal. Skin is warm and moist.  I see no step off deformity, no midline bony tenderness, erythema, induration, bulging.  Pt is able to ambulate.  No crepitus, laceration, effusion, induration, lesions, swelling.   Pedal pulses are symmetrical and palpable bilateral tenderness to entire back.    Neurological: She is alert and oriented to person, place, and time.  Skin: Skin is warm and dry.  Psychiatric: She has a normal mood and affect. Her  behavior is normal.  Nursing note and vitals reviewed.   ED Course  Procedures (including critical care time) DIAGNOSTIC STUDIES: Oxygen Saturation is 97% on room air, normal by my interpretation.    COORDINATION OF CARE: 12:38 PM-Discussed treatment plan which includes C, T, and L-Spine X-ray with pt at bedside and pt agreed to plan.  Medications  dexamethasone (DECADRON) injection 10 mg (not administered)  oxyCODONE-acetaminophen (PERCOCET/ROXICET) 5-325 MG per tablet 1 tablet (not administered)  oxyCODONE-acetaminophen (PERCOCET/ROXICET) 5-325 MG per tablet 1 tablet (1 tablet Oral Given 05/07/14 1316)  ondansetron (ZOFRAN-ODT) disintegrating tablet 4 mg (4 mg Oral Given 05/07/14 1316)     Labs Review Labs Reviewed - No data to display  Imaging Review Dg Cervical Spine Complete  05/07/2014   CLINICAL DATA:  medical hx of osteoarthritis, fibromyalgia, and DDD who presents to the Emergency Department complaining of back pain. She states that she had an epidural 3 weeks ago for DDD. She states that she had 5 needles in her neck and while in her back and she woke up during the procedure. She states that the pain is worse after the procedure. She states that she was informed that she had a rheumatoid fever. She states that her pain is this bad all the time. She states that she slipped in the bath tub last night. She states  that she is having associated symptoms of leg pain, neck pain, numbness in the right leg. She states that her pain is all through her back and it radiates to her neck and legs. She states that her last MRI was 1 year ago. She states that she ran out of her meds 6-7 days ago. She states that she was with pain management and she hasn't been with them for months. She states that she has tried Lidoderm cream but with no relief for her symptoms  EXAM: CERVICAL SPINE  4+ VIEWS  COMPARISON:  Cervical spine CT dated 07/24/2013.  FINDINGS: Straightening of the normal cervical lordosis. Multilevel degenerative changes. No bony foraminal stenosis on either side. No fractures or subluxations. Normal soft tissues.  IMPRESSION: 1. No acute abnormality. 2. Multilevel degenerative changes.   Electronically Signed   By: Enrique Sack M.D.   On: 05/07/2014 13:16   Dg Thoracic Spine 2 View  05/07/2014   CLINICAL DATA:  medical hx of osteoarthritis, fibromyalgia, and DDD who presents to the Emergency Department complaining of back pain. She states that she had an epidural 3 weeks ago for DDD. She states that she had 5 needles in her neck and while in her back and she woke up during the procedure. She states that the pain is worse after the procedure. She states that she was informed that she had a rheumatoid fever. She states that her pain is this bad all the time. She states that she slipped in the bath tub last night. She states that she is having associated symptoms of leg pain, neck pain, numbness in the right leg. She states that her pain is all through her back and it radiates to her neck and legs. She states that her last MRI was 1 year ago. She states that she ran out of her meds 6-7 days ago. She states that she was with pain management and she hasn't been with them for months. She states that she has tried Lidoderm cream with no relief for her symptoms  EXAM: THORACIC SPINE - 2 VIEW  COMPARISON:  Chest radiographs dated  01/08/2013.  FINDINGS: Mild  scoliosis. Mild anterior spur formation at multiple levels of the thoracic spine. Mild to moderate anterior and lateral spur formation in the upper lumbar spine. No fractures or subluxations.  IMPRESSION: Multilevel degenerative changes.   Electronically Signed   By: Enrique Sack M.D.   On: 05/07/2014 13:14   Dg Lumbar Spine Complete  05/07/2014   CLINICAL DATA:  Back pain, slipped and fell last night  EXAM: LUMBAR SPINE - COMPLETE 4+ VIEW  COMPARISON:  Lumbar spine MRI 08/09/2014  FINDINGS: There is no evidence of lumbar spine fracture. Alignment is normal. Intervertebral disc spaces are maintained.  IMPRESSION: Negative.   Electronically Signed   By: Conchita Paris M.D.   On: 05/07/2014 13:29     EKG Interpretation None      MDM   Final diagnoses:  Pain  Exacerbation of chronic back pain   Pt frequently has dramatic pain displays per neurologist. Dr. Jannifer Franklin with neuro is concerned that her symptoms are mainly psychologically based and non organic. He has stopped giving her narcotic pain medications at this time from there office and feels that she should be off all narcotic pain medications (visit on 03/08/2014). Pt reports [name] will no longer see her in his office because "she screams too loud" and her pain management clinic let her go because " she refused back surgery".   She reports having a recent fall and injections 3 weeks ago. The pain exacerbation that she is having today is severe but not different than her normal ,per patient. She came to the ED today because her son wanted her to get looked at. The patient ambulated with cane in the ED. Her vital signs are stable and she is not having any systemic symptoms. Regular x-rays did not show any concerning findings.   My biggest concern would be spinal abscess, subdural hematoma, acute injury from fall-- at this time, none of her symptoms or physical exam findings are consistent with this. It is difficult  because she has severe pain at baseline with RA and Fibromyalgia.   43 y.o.Darnell L Sabas's  with back pain. No neurological deficits and normal neuro exam. Patient can walk. No loss of bowel or bladder control. No concern for cauda equina at this time base on HPI and physical exam findings. No fever, night sweats, weight loss, h/o cancer, IVDU.   RICE protocol and pain medicine indicated and discussed with patient.   Patient Plan 1. Medications: pain medication and muscle relaxer. Cont usual home medications unless otherwise directed. 2. Treatment: rest, drink plenty of fluids, gentle stretching as discussed, alternate ice and heat  3. Follow Up: Please followup with your primary doctor for discussion of your diagnoses and further evaluation after today's visit; if you do not have a primary care doctor use the resource guide provided to find one  Advised to follow-up with the orthopedist if symptoms do not start to resolve in the next 2-3 days. If develop loss of bowel or urinary control return to the ED as soon as possible for further evaluation. To take the medications as prescribed as they can cause harm if not taken appropriately.   Vital signs are stable at discharge. Filed Vitals:   05/07/14 1201  BP: 148/97  Pulse: 84  Temp: 98.3 F (36.8 C)  Resp: 20    Patient/guardian has voiced understanding and agreed to follow-up with the PCP or specialist.       I personally performed the services described in this documentation, which  was scribed in my presence. The recorded information has been reviewed and is accurate.    Linus Mako, PA-C 05/07/14 1356  Ernestina Patches, MD 05/07/14 [phone]

## 2014-05-07 NOTE — ED Notes (Signed)
Pt reports she had an epidural 3 weeks ago for degenerative disc disease, pt reports pain got worse afterwards and is from neck down lower back to feet. Pt also reports she slipped in bathtub last night. Has been using cane to walk.

## 2014-05-20 ENCOUNTER — Encounter (HOSPITAL_COMMUNITY): Payer: Self-pay | Admitting: *Deleted

## 2014-05-20 ENCOUNTER — Emergency Department (HOSPITAL_COMMUNITY): Payer: Medicaid Other

## 2014-05-20 ENCOUNTER — Emergency Department (HOSPITAL_COMMUNITY)
Admission: EM | Admit: 2014-05-20 | Discharge: 2014-05-20 | Disposition: A | Discharge status: Home / Self Care | Payer: Medicaid Other | Attending: Emergency Medicine | Admitting: Emergency Medicine

## 2014-05-20 DIAGNOSIS — M1990 Unspecified osteoarthritis, unspecified site: Secondary | ICD-10-CM | POA: Insufficient documentation

## 2014-05-20 DIAGNOSIS — Z87891 Personal history of nicotine dependence: Secondary | ICD-10-CM | POA: Insufficient documentation

## 2014-05-20 DIAGNOSIS — Y92091 Bathroom in other non-institutional residence as the place of occurrence of the external cause: Secondary | ICD-10-CM | POA: Diagnosis not present

## 2014-05-20 DIAGNOSIS — W010XXA Fall on same level from slipping, tripping and stumbling without subsequent striking against object, initial encounter: Secondary | ICD-10-CM | POA: Diagnosis not present

## 2014-05-20 DIAGNOSIS — Y998 Other external cause status: Secondary | ICD-10-CM | POA: Diagnosis not present

## 2014-05-20 DIAGNOSIS — G8929 Other chronic pain: Secondary | ICD-10-CM | POA: Diagnosis not present

## 2014-05-20 DIAGNOSIS — R52 Pain, unspecified: Secondary | ICD-10-CM

## 2014-05-20 DIAGNOSIS — Z791 Long term (current) use of non-steroidal anti-inflammatories (NSAID): Secondary | ICD-10-CM | POA: Insufficient documentation

## 2014-05-20 DIAGNOSIS — S3210XA Unspecified fracture of sacrum, initial encounter for closed fracture: Secondary | ICD-10-CM

## 2014-05-20 DIAGNOSIS — Y93E1 Activity, personal bathing and showering: Secondary | ICD-10-CM | POA: Insufficient documentation

## 2014-05-20 DIAGNOSIS — Z79899 Other long term (current) drug therapy: Secondary | ICD-10-CM | POA: Insufficient documentation

## 2014-05-20 DIAGNOSIS — E785 Hyperlipidemia, unspecified: Secondary | ICD-10-CM | POA: Diagnosis not present

## 2014-05-20 DIAGNOSIS — I10 Essential (primary) hypertension: Secondary | ICD-10-CM | POA: Diagnosis not present

## 2014-05-20 DIAGNOSIS — M797 Fibromyalgia: Secondary | ICD-10-CM | POA: Insufficient documentation

## 2014-05-20 DIAGNOSIS — G629 Polyneuropathy, unspecified: Secondary | ICD-10-CM | POA: Diagnosis not present

## 2014-05-20 DIAGNOSIS — Z8742 Personal history of other diseases of the female genital tract: Secondary | ICD-10-CM | POA: Insufficient documentation

## 2014-05-20 DIAGNOSIS — E559 Vitamin D deficiency, unspecified: Secondary | ICD-10-CM | POA: Diagnosis not present

## 2014-05-20 DIAGNOSIS — Z7952 Long term (current) use of systemic steroids: Secondary | ICD-10-CM | POA: Diagnosis not present

## 2014-05-20 DIAGNOSIS — S79911A Unspecified injury of right hip, initial encounter: Secondary | ICD-10-CM | POA: Diagnosis not present

## 2014-05-20 DIAGNOSIS — E669 Obesity, unspecified: Secondary | ICD-10-CM | POA: Insufficient documentation

## 2014-05-20 DIAGNOSIS — S3992XA Unspecified injury of lower back, initial encounter: Secondary | ICD-10-CM | POA: Insufficient documentation

## 2014-05-20 DIAGNOSIS — M545 Low back pain: Secondary | ICD-10-CM

## 2014-05-20 DIAGNOSIS — Z8659 Personal history of other mental and behavioral disorders: Secondary | ICD-10-CM | POA: Insufficient documentation

## 2014-05-20 MED ORDER — OXYCODONE-ACETAMINOPHEN 5-325 MG PO TABS: 1.0000 | ORAL_TABLET | Freq: Four times a day (QID) | ORAL | Status: DC | PRN

## 2014-05-20 MED ORDER — OXYCODONE-ACETAMINOPHEN 5-325 MG PO TABS
2.0000 | ORAL_TABLET | Freq: Once | ORAL | Status: AC
  Administered 2014-05-20: 2 via ORAL
  Filled 2014-05-20: qty 2

## 2014-05-20 MED ORDER — MORPHINE SULFATE 4 MG/ML IJ SOLN
6.0000 mg | Freq: Once | INTRAMUSCULAR | Status: AC
  Administered 2014-05-20: 6 mg via INTRAMUSCULAR
  Filled 2014-05-20: qty 2

## 2014-05-20 NOTE — ED Provider Notes (Signed)
CSN: [id number]     Arrival date & time 05/20/14  0844 History   First MD Initiated Contact with Patient 05/20/14 [phone]     Chief Complaint  Patient presents with  . Back Pain     (Consider location/radiation/quality/duration/timing/severity/associated sxs/prior Treatment) HPI  Pt with hx of chronic low back pain presents with an increase in her chronic pain.  Pt states that she slipped last night and fell in her shower.  She landed on her bottom and low back.  She c/o low back pain and pain radiating into her right hip.  Pain is worse with movement, palpation, different positions.  No leg weakness, no urinary retention or loss of control of bowel or bladder.  No fever.  Pt had been using percocet but has run out of percocet.  She is getting set up for appointment with pain management on 12/17- this will be her first appointment.  There are no other associated systemic symptoms, there are no other alleviating or modifying factors.    Past Medical History  Diagnosis Date  . Arthritis     neck, central lumbar area  . Neuropathy   . Hypertension   . Cervical spine disease 10/2012  . RP (retinitis pigmentosa)   . Fibroid tumor in breast  . Chronic pain syndrome   . Depression   . Vitamin D deficiency   . Myalgia and myositis, unspecified 04/07/2013  . Obesity   . Headache(784.0)   . Dyslipidemia   . Fibromyalgia   . Memory deficits 08/11/2013  . Osteoarthritis    Past Surgical History  Procedure Laterality Date  . Tubal ligation     Family History  Problem Relation Age of Onset  . Diabetes Maternal Grandmother   . Alzheimer's disease Maternal Grandmother    History  Substance Use Topics  . Smoking status: Former Smoker -- 0.33 packs/day for 12 years    Types: Cigarettes    Quit date: 06/30/2010  . Smokeless tobacco: Never Used     Comment: 1pack per 3 days.   . Alcohol Use: No   OB History    No data available     Review of Systems  ROS reviewed and all otherwise  negative except for mentioned in HPI    Allergies  Review of patient's allergies indicates no known allergies.  Home Medications   Prior to Admission medications   Medication Sig Start Date End Date Taking? Authorizing Provider  butalbital-acetaminophen-caffeine (FIORICET) 50-325-40 MG per tablet Take 1 tablet by mouth 4 (four) times daily as needed for headache.   Yes Historical Provider, MD  Cholecalciferol (RA VITAMIN D-3) 1000 UNITS tablet Take 1,000 Units by mouth daily.   Yes Historical Provider, MD  cyclobenzaprine (FLEXERIL) 10 MG tablet Take 10 mg by mouth 3 (three) times daily as needed for muscle spasms.   Yes Historical Provider, MD  cycloSPORINE (RESTASIS) 0.05 % ophthalmic emulsion Place 1 drop into both eyes 2 (two) times daily.   Yes Historical Provider, MD  gabapentin (NEURONTIN) 300 MG capsule Take 300 mg by mouth 2 (two) times daily.   Yes Historical Provider, MD  indomethacin (INDOCIN) 50 MG capsule Take 50 mg by mouth 3 (three) times daily with meals.   Yes Historical Provider, MD  lidocaine (LIDODERM) 5 % Place 1 patch onto the skin daily. Remove & Discard patch within 12 hours or as directed by MD 05/07/14  Yes Tiffany Marilu Favre, PA-C  lisinopril-hydrochlorothiazide (PRINZIDE,ZESTORETIC) 10-12.5 MG per tablet Take 1 tablet by  mouth daily.   Yes Historical Provider, MD  metoprolol tartrate (LOPRESSOR) 25 MG tablet Take 25 mg by mouth daily.   Yes Historical Provider, MD  pravastatin (PRAVACHOL) 20 MG tablet Take 20 mg by mouth at bedtime.   Yes Historical Provider, MD  oxyCODONE-acetaminophen (PERCOCET/ROXICET) 5-325 MG per tablet Take 1-2 tablets by mouth every 6 (six) hours as needed for severe pain. 05/20/14   Threasa Beards, MD  predniSONE (DELTASONE) 20 MG tablet 3 tabs po daily x 3 days, then 2 tabs x 3 days, then 1.5 tabs x 3 days, then 1 tab x 3 days, then 0.5 tabs x 3 days 10/30/13   [name], MD  ranitidine (ZANTAC) 300 MG tablet Take 1 tablet (300 mg  total) by mouth at bedtime. 06/04/13   Ankit Kathrynn Humble, MD   BP 147/64 mmHg  Pulse 82  Temp(Src) 98.2 F (36.8 C) (Oral)  Resp 18  Ht 5\' 2"  (1.575 m)  Wt 221 lb (100.245 kg)  BMI 40.41 kg/m2  SpO2 97%  LMP 05/07/2014  Vitals reviewed Physical Exam  Physical Examination: General appearance - alert, well appearing, and in no distress Mental status - alert, oriented to person, place, and time Eyes - no conjunctival injection, no scleral icterus Chest - clear to auscultation, no wheezes, rales or rhonchi, symmetric air entry Heart - normal rate, regular rhythm, normal S1, S2, no murmurs, rubs, clicks or gallops Abdomen - soft, nontender, nondistended, no masses or organomegaly Back exam - midline tenderness to palpation in lumbar spine and sacral region, also paraspinal tenderness in bilateral lumbar region Neurological - alert, oriented x 3, strength 5/5 in extremities x 4, sensation intact Musculoskeletal - ttp over right lateral hip and pain with ROM of right hip, no joint tenderness, deformity or swelling Extremities - peripheral pulses normal, no pedal edema, no clubbing or cyanosis Skin - normal coloration and turgor, no rashes  ED Course  Procedures (including critical care time) Labs Review Labs Reviewed - No data to display  Imaging Review Dg Lumbar Spine Complete  05/20/2014   CLINICAL DATA:  Fall yesterday onto tailbone in bathtub. Pain radiates down to both feet. Initial encounter.  EXAM: LUMBAR SPINE - COMPLETE 4+ VIEW  COMPARISON:  05/07/2014  FINDINGS: There are 5 non rib-bearing lumbar type vertebral bodies. Slight left convex thoracolumbar curvature is similar to the prior study. There is no listhesis. Vertebral body heights are preserved without evidence of compression fracture. Moderate anterior osteophytosis is again seen at T11-12. Mild multilevel endplate osteophytosis is present in the lumbar spine with mild multilevel disc space narrowing, most notably at L4-5 and  L5-S1 and and not significantly changed. No pars defects are identified. Sacrum and coccyx are more fully evaluated on separate dedicated radiographs.  IMPRESSION: No acute osseous abnormality identified.  Mild spondylosis.   Electronically Signed   By: Logan Bores   On: 05/20/2014 10:46   Dg Sacrum/coccyx  05/20/2014   CLINICAL DATA:  [place] [address] onto tailbone in bathtub at home yesterday, pain, history of chronic pain down legs to both feet  EXAM: SACRUM AND COCCYX - 2+ VIEW  COMPARISON:  Lumbar spine radiographs 11/01/2012  FINDINGS: SI joints and sacral neural foramina symmetric.  Osseous mineralization normal.  Visualized lumbar spine and pelvis normal appearance.  Minimally displaced distal sacral fracture, demonstrating slight interval change since 11/01/2012.  IMPRESSION: Minimally displaced distal sacral fracture.   Electronically Signed   By: Lavonia Dana M.D.   On: 05/20/2014 10:56  Dg Hip Complete Right  05/20/2014   CLINICAL DATA:  Fall yesterday on the tailbone. Pain radiates down to both feet. Initial encounter.  EXAM: RIGHT HIP - COMPLETE 2+ VIEW  COMPARISON:  None.  FINDINGS: There is no evidence of acute fracture or dislocation. Bone mineralization appears normal without lytic or blastic osseous lesion identified. Calcifications in the pelvis may represent phleboliths.  IMPRESSION: No acute osseous abnormality identified.   Electronically Signed   By: Logan Bores   On: 05/20/2014 10:48     EKG Interpretation None      MDM   Final diagnoses:  Pain  Bilateral low back pain, with sciatica presence unspecified  Sacral fracture, closed, initial encounter    Pt presenting with c/o low back pain exacerbation after fall last night.  Xray shows minimally displaced sacral fracture- unclear whether this is acute or chronic as it was seen also on an xray in 2014.  Pt given crutches and pain medication for comfort.  Orthopedic follouwp.  She has appointment to start with pain management on  12/17.  Discharged with strict return precautions.  Pt agreeable with plan.    Threasa Beards, MD 05/20/14 [phone]

## 2014-05-20 NOTE — ED Notes (Signed)
Patient states she has chronic low back pain related to DDD and spinal stenosis.  Patient also has rheumatoid arthritis and osteoarthritis as well.  Patient states she slipped in the shower last night and fell, landing on her sacrum.  Patient states pain is radiating into her legs as well.  Patient also c/o numbness/tingling, but no new incontinence (patient states she is sometimes incontinent at night ordinarily).  Patient states she had an epidural nerve block procedure in September.  Patient received a Percocet rx for 5/325 mg on 05/09/14, but states she has finished those and no longer has any pain meds at home and doesn't have an appointment with her pain management clinic until 06/15/2014.  Patient denies N/V/D, SOB and chest pain.

## 2014-05-20 NOTE — Discharge Instructions (Signed)
Return to the ED with any concerns including weakness of legs, not able to urinate, loss of control of bowel or bladder, decreased level of alertness/lethargy, or any other alarming symptoms °

## 2014-05-28 ENCOUNTER — Emergency Department (HOSPITAL_COMMUNITY)
Admission: EM | Admit: 2014-05-28 | Discharge: 2014-05-28 | Disposition: A | Discharge status: Home / Self Care | Payer: Medicaid Other | Attending: Emergency Medicine | Admitting: Emergency Medicine

## 2014-05-28 ENCOUNTER — Encounter (HOSPITAL_COMMUNITY): Payer: Self-pay | Admitting: Emergency Medicine

## 2014-05-28 DIAGNOSIS — M1990 Unspecified osteoarthritis, unspecified site: Secondary | ICD-10-CM | POA: Insufficient documentation

## 2014-05-28 DIAGNOSIS — R200 Anesthesia of skin: Secondary | ICD-10-CM | POA: Diagnosis not present

## 2014-05-28 DIAGNOSIS — M797 Fibromyalgia: Secondary | ICD-10-CM | POA: Insufficient documentation

## 2014-05-28 DIAGNOSIS — E559 Vitamin D deficiency, unspecified: Secondary | ICD-10-CM | POA: Insufficient documentation

## 2014-05-28 DIAGNOSIS — Z79899 Other long term (current) drug therapy: Secondary | ICD-10-CM | POA: Insufficient documentation

## 2014-05-28 DIAGNOSIS — I10 Essential (primary) hypertension: Secondary | ICD-10-CM | POA: Insufficient documentation

## 2014-05-28 DIAGNOSIS — G629 Polyneuropathy, unspecified: Secondary | ICD-10-CM | POA: Insufficient documentation

## 2014-05-28 DIAGNOSIS — E785 Hyperlipidemia, unspecified: Secondary | ICD-10-CM | POA: Insufficient documentation

## 2014-05-28 DIAGNOSIS — Z87891 Personal history of nicotine dependence: Secondary | ICD-10-CM | POA: Insufficient documentation

## 2014-05-28 DIAGNOSIS — F329 Major depressive disorder, single episode, unspecified: Secondary | ICD-10-CM | POA: Diagnosis not present

## 2014-05-28 DIAGNOSIS — Z791 Long term (current) use of non-steroidal anti-inflammatories (NSAID): Secondary | ICD-10-CM | POA: Insufficient documentation

## 2014-05-28 DIAGNOSIS — M549 Dorsalgia, unspecified: Secondary | ICD-10-CM | POA: Diagnosis present

## 2014-05-28 DIAGNOSIS — G8929 Other chronic pain: Secondary | ICD-10-CM | POA: Diagnosis not present

## 2014-05-28 DIAGNOSIS — Z8742 Personal history of other diseases of the female genital tract: Secondary | ICD-10-CM | POA: Diagnosis not present

## 2014-05-28 DIAGNOSIS — E669 Obesity, unspecified: Secondary | ICD-10-CM | POA: Insufficient documentation

## 2014-05-28 MED ORDER — OXYCODONE-ACETAMINOPHEN 5-325 MG PO TABS
2.0000 | ORAL_TABLET | Freq: Once | ORAL | Status: AC
  Administered 2014-05-28: 2 via ORAL
  Filled 2014-05-28: qty 2

## 2014-05-28 NOTE — ED Provider Notes (Signed)
CSN: [id number]     Arrival date & time 05/28/14  5885 History   First MD Initiated Contact with Patient 05/28/14 [phone]     Chief Complaint  Patient presents with  . Back Pain  . body pain      (Consider location/radiation/quality/duration/timing/severity/associated sxs/prior Treatment) HPI Comments: Patient presents with pain all over. She has a history of fibromyalgia and has chronic pain. Her main pain is in her back and lower extremities. She comes in requesting pain medication. She has chronic numbness in her feet which is unchanged from baseline. She denies any new injuries. She denies any fevers chills or recent illnesses. She has an upcoming appointment with the pain medicine clinic but it's not for December 17. She's been here twice before this month for similar complaints. She's gotten two prescriptions for narcotic pain medications this month from the emergency department.   Past Medical History  Diagnosis Date  . Arthritis     neck, central lumbar area  . Neuropathy   . Hypertension   . Cervical spine disease 10/2012  . RP (retinitis pigmentosa)   . Fibroid tumor in breast  . Chronic pain syndrome   . Depression   . Vitamin D deficiency   . Myalgia and myositis, unspecified 04/07/2013  . Obesity   . Headache(784.0)   . Dyslipidemia   . Fibromyalgia   . Memory deficits 08/11/2013  . Osteoarthritis    Past Surgical History  Procedure Laterality Date  . Tubal ligation     Family History  Problem Relation Age of Onset  . Diabetes Maternal Grandmother   . Alzheimer's disease Maternal Grandmother    History  Substance Use Topics  . Smoking status: Former Smoker -- 0.33 packs/day for 12 years    Types: Cigarettes    Quit date: 06/30/2010  . Smokeless tobacco: Never Used     Comment: 1pack per 3 days.   . Alcohol Use: No   OB History    No data available     Review of Systems  Constitutional: Negative for fever, chills, diaphoresis and fatigue.  HENT:  Negative for congestion, rhinorrhea and sneezing.   Eyes: Negative.   Respiratory: Negative for cough, chest tightness and shortness of breath.   Cardiovascular: Negative for chest pain and leg swelling.  Gastrointestinal: Negative for nausea, vomiting, abdominal pain, diarrhea and blood in stool.  Genitourinary: Negative for frequency, hematuria, flank pain and difficulty urinating.  Musculoskeletal: Positive for myalgias, back pain and arthralgias.  Skin: Negative for rash.  Neurological: Positive for numbness. Negative for dizziness, speech difficulty, weakness and headaches.      Allergies  Review of patient's allergies indicates no known allergies.  Home Medications   Prior to Admission medications   Medication Sig Start Date End Date Taking? Authorizing Provider  butalbital-acetaminophen-caffeine (FIORICET) 50-325-40 MG per tablet Take 1 tablet by mouth 4 (four) times daily as needed for headache.   Yes Historical Provider, MD  carisoprodol (SOMA) 350 MG tablet Take 350 mg by mouth 3 (three) times daily as needed for muscle spasms.   Yes Historical Provider, MD  Cholecalciferol (RA VITAMIN D-3) 1000 UNITS tablet Take 1,000 Units by mouth daily.   Yes Historical Provider, MD  cycloSPORINE (RESTASIS) 0.05 % ophthalmic emulsion Place 1 drop into both eyes 2 (two) times daily as needed (for eye irritation).    Yes Historical Provider, MD  gabapentin (NEURONTIN) 300 MG capsule Take 300 mg by mouth 2 (two) times daily.   Yes Historical Provider,  MD  indomethacin (INDOCIN) 50 MG capsule Take 50 mg by mouth 3 (three) times daily with meals.   Yes Historical Provider, MD  metoprolol tartrate (LOPRESSOR) 25 MG tablet Take 25 mg by mouth daily.   Yes Historical Provider, MD  oxyCODONE-acetaminophen (PERCOCET/ROXICET) 5-325 MG per tablet Take 1-2 tablets by mouth every 6 (six) hours as needed for severe pain. 05/20/14  Yes Threasa Beards, MD  pravastatin (PRAVACHOL) 20 MG tablet Take 20 mg by  mouth at bedtime.   Yes Historical Provider, MD  ranitidine (ZANTAC) 300 MG tablet Take 1 tablet (300 mg total) by mouth at bedtime. Patient taking differently: Take 300 mg by mouth 3 times/day as needed-between meals & bedtime for heartburn.  06/04/13  Yes [name], MD  cyclobenzaprine (FLEXERIL) 10 MG tablet Take 10 mg by mouth 3 (three) times daily as needed for muscle spasms.    Historical Provider, MD  lidocaine (LIDODERM) 5 % Place 1 patch onto the skin daily. Remove & Discard patch within 12 hours or as directed by MD Patient not taking: Reported on 05/28/2014 05/07/14   Linus Mako, PA-C  lisinopril-hydrochlorothiazide (PRINZIDE,ZESTORETIC) 10-12.5 MG per tablet Take 1 tablet by mouth daily.    Historical Provider, MD  predniSONE (DELTASONE) 20 MG tablet 3 tabs po daily x 3 days, then 2 tabs x 3 days, then 1.5 tabs x 3 days, then 1 tab x 3 days, then 0.5 tabs x 3 days Patient not taking: Reported on 05/28/2014 10/30/13   [name], MD   BP 140/74 mmHg  Pulse 107  Temp(Src) 98.5 F (36.9 C) (Oral)  Resp 24  SpO2 96%  LMP 05/14/2014 Physical Exam  Constitutional: She is oriented to person, place, and time. She appears well-developed and well-nourished.  HENT:  Head: Normocephalic and atraumatic.  Eyes: Pupils are equal, round, and reactive to light.  Neck: Normal range of motion. Neck supple.  Cardiovascular: Normal rate, regular rhythm and normal heart sounds.   Pulmonary/Chest: Effort normal and breath sounds normal. No respiratory distress. She has no wheezes. She has no rales. She exhibits no tenderness.  Abdominal: Soft. Bowel sounds are normal. There is no tenderness. There is no rebound and no guarding.  Musculoskeletal: Normal range of motion. She exhibits no edema.  Diffuse pain along the musculature of the back bilaterally. There some tenderness along the lumbar spine. No step-offs or deformities are noted. Negative straight leg raise bilaterally. She  has some numbness to light touch in the feet bilaterally which she says is unchanged from her baseline. Pedal pulses are intact. She has normal motor function in the lower extremities. Negative straight leg raise bilaterally.  Lymphadenopathy:    She has no cervical adenopathy.  Neurological: She is alert and oriented to person, place, and time.  Skin: Skin is warm and dry. No rash noted.  Psychiatric: She has a normal mood and affect.    ED Course  Procedures (including critical care time) Labs Review Labs Reviewed - No data to display  Imaging Review No results found.   EKG Interpretation None      MDM   Final diagnoses:  Chronic pain    Patient appears to have chronic pain issues. She's gotten to prescriptions for pain medications in the ED this month. There is a note from a neurologist that she saw in September for a consult who recommended that patient be taken off of all controlled substances, that this particular neurologist felt that her complaints are primarily  psychosomatic in nature. Patient was given dose of pain medication ED but I did explain to her that she needs to seek outpatient care for her ongoing pain medication needs.    Malvin Johns, MD 05/28/14 1550

## 2014-05-28 NOTE — Discharge Instructions (Signed)

## 2014-05-28 NOTE — ED Notes (Signed)
Patient states she ran out of pain medicine 3 days ago. Patient was taking Oxycodone. Patient  states that she has arthritis all over.

## 2014-05-28 NOTE — ED Notes (Addendum)
Pt from home c/o "pain every where". Pt reports her pain "got real bad" yesterday.  When assisting patient ( with 2 person assist and pt using cane) from wheelchair to stretcher patient proceeds to slowly ease to the floor (no fall). Pt then calls her soon to lift her up to the bed "like we do at home". Patient states "I have chronic pain but I don't have anything to take for it".  When asked about if she has had a fever patient states "I have everything"

## 2014-07-28 ENCOUNTER — Encounter (HOSPITAL_COMMUNITY): Payer: Self-pay

## 2014-07-28 ENCOUNTER — Emergency Department (HOSPITAL_COMMUNITY): Payer: Medicaid Other

## 2014-07-28 ENCOUNTER — Emergency Department (HOSPITAL_COMMUNITY)
Admission: EM | Admit: 2014-07-28 | Discharge: 2014-07-28 | Disposition: A | Discharge status: Home / Self Care | Payer: Medicaid Other | Attending: Emergency Medicine | Admitting: Emergency Medicine

## 2014-07-28 DIAGNOSIS — I10 Essential (primary) hypertension: Secondary | ICD-10-CM | POA: Diagnosis not present

## 2014-07-28 DIAGNOSIS — Y998 Other external cause status: Secondary | ICD-10-CM | POA: Insufficient documentation

## 2014-07-28 DIAGNOSIS — S161XXA Strain of muscle, fascia and tendon at neck level, initial encounter: Secondary | ICD-10-CM | POA: Diagnosis not present

## 2014-07-28 DIAGNOSIS — W19XXXA Unspecified fall, initial encounter: Secondary | ICD-10-CM

## 2014-07-28 DIAGNOSIS — F329 Major depressive disorder, single episode, unspecified: Secondary | ICD-10-CM | POA: Diagnosis not present

## 2014-07-28 DIAGNOSIS — M1990 Unspecified osteoarthritis, unspecified site: Secondary | ICD-10-CM | POA: Diagnosis not present

## 2014-07-28 DIAGNOSIS — Z79899 Other long term (current) drug therapy: Secondary | ICD-10-CM | POA: Diagnosis not present

## 2014-07-28 DIAGNOSIS — Y9289 Other specified places as the place of occurrence of the external cause: Secondary | ICD-10-CM | POA: Diagnosis not present

## 2014-07-28 DIAGNOSIS — Z8742 Personal history of other diseases of the female genital tract: Secondary | ICD-10-CM | POA: Diagnosis not present

## 2014-07-28 DIAGNOSIS — G629 Polyneuropathy, unspecified: Secondary | ICD-10-CM | POA: Diagnosis not present

## 2014-07-28 DIAGNOSIS — Z791 Long term (current) use of non-steroidal anti-inflammatories (NSAID): Secondary | ICD-10-CM | POA: Insufficient documentation

## 2014-07-28 DIAGNOSIS — E559 Vitamin D deficiency, unspecified: Secondary | ICD-10-CM | POA: Insufficient documentation

## 2014-07-28 DIAGNOSIS — Z87891 Personal history of nicotine dependence: Secondary | ICD-10-CM | POA: Insufficient documentation

## 2014-07-28 DIAGNOSIS — E669 Obesity, unspecified: Secondary | ICD-10-CM | POA: Insufficient documentation

## 2014-07-28 DIAGNOSIS — W01198A Fall on same level from slipping, tripping and stumbling with subsequent striking against other object, initial encounter: Secondary | ICD-10-CM | POA: Diagnosis not present

## 2014-07-28 DIAGNOSIS — S39012A Strain of muscle, fascia and tendon of lower back, initial encounter: Secondary | ICD-10-CM | POA: Diagnosis not present

## 2014-07-28 DIAGNOSIS — Y9389 Activity, other specified: Secondary | ICD-10-CM | POA: Insufficient documentation

## 2014-07-28 DIAGNOSIS — S0990XA Unspecified injury of head, initial encounter: Secondary | ICD-10-CM | POA: Diagnosis not present

## 2014-07-28 DIAGNOSIS — W07XXXA Fall from chair, initial encounter: Secondary | ICD-10-CM | POA: Insufficient documentation

## 2014-07-28 DIAGNOSIS — G894 Chronic pain syndrome: Secondary | ICD-10-CM | POA: Insufficient documentation

## 2014-07-28 MED ORDER — NAPROXEN 500 MG PO TABS: 500.0000 mg | ORAL_TABLET | Freq: Two times a day (BID) | ORAL | Status: DC

## 2014-07-28 MED ORDER — TRAMADOL HCL 50 MG PO TABS: 50.0000 mg | ORAL_TABLET | Freq: Four times a day (QID) | ORAL | Status: DC | PRN

## 2014-07-28 MED ORDER — OXYCODONE-ACETAMINOPHEN 5-325 MG PO TABS
1.0000 | ORAL_TABLET | Freq: Once | ORAL | Status: AC
  Administered 2014-07-28: 1 via ORAL
  Filled 2014-07-28: qty 1

## 2014-07-28 NOTE — Discharge Instructions (Signed)
Naprosyn and tramadol for pain. Stretch. Heating pads. Follow up with your doctor for recheck.   Cervical Sprain A cervical sprain is an injury in the neck in which the strong, fibrous tissues (ligaments) that connect your neck bones stretch or tear. Cervical sprains can range from mild to severe. Severe cervical sprains can cause the neck vertebrae to be unstable. This can lead to damage of the spinal cord and can result in serious nervous system problems. The amount of time it takes for a cervical sprain to get better depends on the cause and extent of the injury. Most cervical sprains heal in 1 to 3 weeks. CAUSES  Severe cervical sprains may be caused by:   Contact sport injuries (such as from football, rugby, wrestling, hockey, auto racing, gymnastics, diving, martial arts, or boxing).   Motor vehicle collisions.   Whiplash injuries. This is an injury from a sudden forward and backward whipping movement of the head and neck.  Falls.  Mild cervical sprains may be caused by:   Being in an awkward position, such as while cradling a telephone between your ear and shoulder.   Sitting in a chair that does not offer proper support.   Working at a poorly Landscape [employer] station.   Looking up or down for long periods of time.  SYMPTOMS   Pain, soreness, stiffness, or a burning sensation in the front, back, or sides of the neck. This discomfort may develop immediately after the injury or slowly, 24 hours or more after the injury.   Pain or tenderness directly in the middle of the back of the neck.   Shoulder or upper back pain.   Limited ability to move the neck.   Headache.   Dizziness.   Weakness, numbness, or tingling in the hands or arms.   Muscle spasms.   Difficulty swallowing or chewing.   Tenderness and swelling of the neck.  DIAGNOSIS  Most of the time your health care provider can diagnose a cervical sprain by taking your history and doing a  physical exam. Your health care provider will ask about previous neck injuries and any known neck problems, such as arthritis in the neck. X-rays may be taken to find out if there are any other problems, such as with the bones of the neck. Other tests, such as a CT scan or MRI, may also be needed.  TREATMENT  Treatment depends on the severity of the cervical sprain. Mild sprains can be treated with rest, keeping the neck in place (immobilization), and pain medicines. Severe cervical sprains are immediately immobilized. Further treatment is done to help with pain, muscle spasms, and other symptoms and may include:  Medicines, such as pain relievers, numbing medicines, or muscle relaxants.   Physical therapy. This may involve stretching exercises, strengthening exercises, and posture training. Exercises and improved posture can help stabilize the neck, strengthen muscles, and help stop symptoms from returning.  HOME CARE INSTRUCTIONS   Put ice on the injured area.   Put ice in a plastic bag.   Place a towel between your skin and the bag.   Leave the ice on for 15-20 minutes, 3-4 times a day.   If your injury was severe, you may have been given a cervical collar to wear. A cervical collar is a two-piece collar designed to keep your neck from moving while it heals.  Do not remove the collar unless instructed by your health care provider.  If you have long hair, keep it outside  of the collar.  Ask your health care provider before making any adjustments to your collar. Minor adjustments may be required over time to improve comfort and reduce pressure on your chin or on the back of your head.  Ifyou are allowed to remove the collar for cleaning or bathing, follow your health care provider's instructions on how to do so safely.  Keep your collar clean by wiping it with mild soap and water and drying it completely. If the collar you have been given includes removable pads, remove them every  1-2 days and hand wash them with soap and water. Allow them to air dry. They should be completely dry before you wear them in the collar.  If you are allowed to remove the collar for cleaning and bathing, wash and dry the skin of your neck. Check your skin for irritation or sores. If you see any, tell your health care provider.  Do not drive while wearing the collar.   Only take over-the-counter or prescription medicines for pain, discomfort, or fever as directed by your health care provider.   Keep all follow-up appointments as directed by your health care provider.   Keep all physical therapy appointments as directed by your health care provider.   Make any needed adjustments to your workstation to promote good posture.   Avoid positions and activities that make your symptoms worse.   Warm up and stretch before being active to help prevent problems.  SEEK MEDICAL CARE IF:   Your pain is not controlled with medicine.   You are unable to decrease your pain medicine over time as planned.   Your activity level is not improving as expected.  SEEK IMMEDIATE MEDICAL CARE IF:   You develop any bleeding.  You develop stomach upset.  You have signs of an allergic reaction to your medicine.   Your symptoms get worse.   You develop new, unexplained symptoms.   You have numbness, tingling, weakness, or paralysis in any part of your body.  MAKE SURE YOU:   Understand these instructions.  Will watch your condition.  Will get help right away if you are not doing well or get worse. Document Released: 04/13/2007 Document Revised: 06/21/2013 Document Reviewed: 12/22/2012 [facility] Patient Information 2015 [place], [place]. This information is not intended to replace advice given to you by your health care provider. Make sure you discuss any questions you have with your health care provider.  Lumbosacral Strain Lumbosacral strain is a strain of any of the parts that make up  your lumbosacral vertebrae. Your lumbosacral vertebrae are the bones that make up the lower third of your backbone. Your lumbosacral vertebrae are held together by muscles and tough, fibrous tissue (ligaments).  CAUSES  A sudden blow to your back can cause lumbosacral strain. Also, anything that causes an excessive stretch of the muscles in the low back can cause this strain. This is typically seen when people exert themselves strenuously, fall, lift heavy objects, bend, or crouch repeatedly. RISK FACTORS  Physically demanding work.  Participation in pushing or pulling sports or sports that require a sudden twist of the back (tennis, golf, baseball).  Weight lifting.  Excessive lower back curvature.  Forward-tilted pelvis.  Weak back or abdominal muscles or both.  Tight hamstrings. SIGNS AND SYMPTOMS  Lumbosacral strain may cause pain in the area of your injury or pain that moves (radiates) down your leg.  DIAGNOSIS Your health care provider can often diagnose lumbosacral strain through a physical exam. In  some cases, you may need tests such as X-ray exams.  TREATMENT  Treatment for your lower back injury depends on many factors that your clinician will have to evaluate. However, most treatment will include the use of anti-inflammatory medicines. HOME CARE INSTRUCTIONS   Avoid hard physical activities (tennis, racquetball, waterskiing) if you are not in proper physical condition for it. This may aggravate or create problems.  If you have a back problem, avoid sports requiring sudden body movements. Swimming and walking are generally safer activities.  Maintain good posture.  Maintain a healthy weight.  For acute conditions, you may put ice on the injured area.  Put ice in a plastic bag.  Place a towel between your skin and the bag.  Leave the ice on for 20 minutes, 2-3 times a day.  When the low back starts healing, stretching and strengthening exercises may be  recommended. SEEK MEDICAL CARE IF:  Your back pain is getting worse.  You experience severe back pain not relieved with medicines. SEEK IMMEDIATE MEDICAL CARE IF:   You have numbness, tingling, weakness, or problems with the use of your arms or legs.  There is a change in bowel or bladder control.  You have increasing pain in any area of the body, including your belly (abdomen).  You notice shortness of breath, dizziness, or feel faint.  You feel sick to your stomach (nauseous), are throwing up (vomiting), or become sweaty.  You notice discoloration of your toes or legs, or your feet get very cold. MAKE SURE YOU:   Understand these instructions.  Will watch your condition.  Will get help right away if you are not doing well or get worse. Document Released: 03/26/2005 Document Revised: 06/21/2013 Document Reviewed: 02/02/2013 [facility] Patient Information 2015 [place], [place]. This information is not intended to replace advice given to you by your health care provider. Make sure you discuss any questions you have with your health care provider.

## 2014-07-28 NOTE — ED Notes (Signed)
Bed: WHALD Expected date:  Expected time:  Means of arrival:  Comments: EMS-fall 

## 2014-07-28 NOTE — ED Notes (Signed)
Patient transported to CT 

## 2014-07-28 NOTE — ED Notes (Signed)
EMS is using Head blocks for c-collar- head blocks will remain intact until EDP clears

## 2014-07-28 NOTE — ED Notes (Signed)
Per [facility]- Pt states she was sitting in a chair and slipped out of chair resulting in neck and back pain. Pt states NO LOC. Pt states she did hit her head on the floor. Spontanous movement assessed. Denies N/V. GCS 15. PEARL. Not [facility]A cleared Presents LSB/C- collar intact.

## 2014-07-28 NOTE — ED Provider Notes (Signed)
CSN: [id number]     Arrival date & time 07/28/14  3235 History   First MD Initiated Contact with Patient 07/28/14 1844     Chief Complaint  Patient presents with  . Fall  . Neck Pain  . Back Pain     (Consider location/radiation/quality/duration/timing/severity/associated sxs/prior Treatment) HPI Donna Coleman is a 44 y.o. female who presents to emergency department after a fall. Patient states she was sitting in a chair, the back of the chair broke and she fell backwards. She states she fell onto her back and hit her head on the floor. She states the area was carpeted. She states she felt dazed but denies loss of consciousness. She states she is having severe pain in her head, neck, lower back. She has history of degenerative disc disease, states she has chronic back issues already. She denies any visual changes, no nausea or vomiting. Movement makes pain worse, nothing makes it better. EMS was called because patient had difficulty getting up, she was immobilized on a spine board. No neuro deficits. MS.  Past Medical History  Diagnosis Date  . Arthritis     neck, central lumbar area  . Neuropathy   . Hypertension   . Cervical spine disease 10/2012  . RP (retinitis pigmentosa)   . Fibroid tumor in breast  . Chronic pain syndrome   . Depression   . Vitamin D deficiency   . Myalgia and myositis, unspecified 04/07/2013  . Obesity   . Headache(784.0)   . Dyslipidemia   . Fibromyalgia   . Memory deficits 08/11/2013  . Osteoarthritis    Past Surgical History  Procedure Laterality Date  . Tubal ligation     Family History  Problem Relation Age of Onset  . Diabetes Maternal Grandmother   . Alzheimer's disease Maternal Grandmother    History  Substance Use Topics  . Smoking status: Former Smoker -- 0.33 packs/day for 12 years    Types: Cigarettes    Quit date: 06/30/2010  . Smokeless tobacco: Never Used     Comment: 1pack per 3 days.   . Alcohol Use: No   OB History    No  data available     Review of Systems  Constitutional: Negative for fever and chills.  Eyes: Negative for photophobia.  Respiratory: Negative for cough, chest tightness and shortness of breath.   Cardiovascular: Negative for chest pain, palpitations and leg swelling.  Gastrointestinal: Negative for nausea, vomiting, abdominal pain and diarrhea.  Genitourinary: Negative for dysuria, flank pain and pelvic pain.  Musculoskeletal: Positive for back pain, arthralgias and neck pain. Negative for myalgias and neck stiffness.  Skin: Negative for rash.  Neurological: Positive for headaches. Negative for dizziness and weakness.  All other systems reviewed and are negative.     Allergies  Review of patient's allergies indicates no known allergies.  Home Medications   Prior to Admission medications   Medication Sig Start Date End Date Taking? Authorizing Provider  butalbital-acetaminophen-caffeine (FIORICET) 50-325-40 MG per tablet Take 1 tablet by mouth 4 (four) times daily as needed for headache.    Historical Provider, MD  carisoprodol (SOMA) 350 MG tablet Take 350 mg by mouth 3 (three) times daily as needed for muscle spasms.    Historical Provider, MD  Cholecalciferol (RA VITAMIN D-3) 1000 UNITS tablet Take 1,000 Units by mouth daily.    Historical Provider, MD  cyclobenzaprine (FLEXERIL) 10 MG tablet Take 10 mg by mouth 3 (three) times daily as needed for muscle spasms.  Historical Provider, MD  cycloSPORINE (RESTASIS) 0.05 % ophthalmic emulsion Place 1 drop into both eyes 2 (two) times daily as needed (for eye irritation).     Historical Provider, MD  gabapentin (NEURONTIN) 300 MG capsule Take 300 mg by mouth 2 (two) times daily.    Historical Provider, MD  indomethacin (INDOCIN) 50 MG capsule Take 50 mg by mouth 3 (three) times daily with meals.    Historical Provider, MD  lidocaine (LIDODERM) 5 % Place 1 patch onto the skin daily. Remove & Discard patch within 12 hours or as directed by  MD Patient not taking: Reported on 05/28/2014 05/07/14   Linus Mako, PA-C  lisinopril-hydrochlorothiazide (PRINZIDE,ZESTORETIC) 10-12.5 MG per tablet Take 1 tablet by mouth daily.    Historical Provider, MD  metoprolol tartrate (LOPRESSOR) 25 MG tablet Take 25 mg by mouth daily.    Historical Provider, MD  oxyCODONE-acetaminophen (PERCOCET/ROXICET) 5-325 MG per tablet Take 1-2 tablets by mouth every 6 (six) hours as needed for severe pain. 05/20/14   Threasa Beards, MD  pravastatin (PRAVACHOL) 20 MG tablet Take 20 mg by mouth at bedtime.    Historical Provider, MD  predniSONE (DELTASONE) 20 MG tablet 3 tabs po daily x 3 days, then 2 tabs x 3 days, then 1.5 tabs x 3 days, then 1 tab x 3 days, then 0.5 tabs x 3 days Patient not taking: Reported on 05/28/2014 10/30/13   [name], MD  ranitidine (ZANTAC) 300 MG tablet Take 1 tablet (300 mg total) by mouth at bedtime. Patient taking differently: Take 300 mg by mouth 3 times/day as needed-between meals & bedtime for heartburn.  06/04/13   Ankit Nanavati, MD   BP 158/94 mmHg  Pulse 73  Temp(Src) 99.2 F (37.3 C) (Oral)  Resp 16  SpO2 99% Physical Exam  Constitutional: She appears well-developed and well-nourished. No distress.  HENT:  Head: Normocephalic.  Eyes: Conjunctivae are normal.  Neck: Neck supple.  Midline cervical spine tenderness.  Cardiovascular: Normal rate, regular rhythm and normal heart sounds.   Pulmonary/Chest: Effort normal and breath sounds normal. No respiratory distress. She has no wheezes. She has no rales.  Abdominal: Soft. Bowel sounds are normal. She exhibits no distension. There is no tenderness. There is no rebound.  Musculoskeletal: She exhibits no edema.  Midline thoracic or lumbar spine tenderness. Tenderness over sacrum and coccyx. Pain is bilateral straight leg raise. No tenderness over her arms or legs. Able to move all joints in upper and lower extremities without difficulty.  Neurological:  She is alert.  5/5 and equal lower extremity strength. 2+ and equal patellar reflexes bilaterally. Pt able to dorsiflex bilateral toes and feet with good strength against resistance. Equal sensation bilaterally over thighs and lower legs.   Skin: Skin is warm and dry.  Psychiatric: She has a normal mood and affect. Her behavior is normal.  Nursing note and vitals reviewed.   ED Course  Procedures (including critical care time) Labs Review Labs Reviewed - No data to display  Imaging Review Dg Thoracic Spine 2 View  07/28/2014   CLINICAL DATA:  Slipped out of chair, resulting in neck and back pain. Did not strike head.  EXAM: THORACIC SPINE - 2 VIEW  COMPARISON:  05/07/2014  FINDINGS: There is mild thoracolumbar curvature. The thoracic vertebral bodies are normal in height, with mild degenerative disc changes. No acute fracture is evident.  IMPRESSION: Negative for acute fracture   Electronically Signed   By: Valerie Roys.D.  On: 07/28/2014 19:48   Dg Lumbar Spine Complete  07/28/2014   CLINICAL DATA:  Slipped out of chair, resulting in neck and back pain. Did not strike head.  EXAM: LUMBAR SPINE - COMPLETE 4+ VIEW  COMPARISON:  None.  FINDINGS: There is mild thoracolumbar curvature. The lumbar vertebrae are normal in height. There is no evidence of acute fracture. Sacroiliac joints appear intact.  IMPRESSION: Negative for acute fracture.   Electronically Signed   By: Andreas Newport M.D.   On: 07/28/2014 19:55   Dg Sacrum/coccyx  07/28/2014   CLINICAL DATA:  Patient was sitting in a chair and slipped out of the chair. Pain.  EXAM: SACRUM AND COCCYX - 2+ VIEW  COMPARISON:  05/20/2014  FINDINGS: There is no evidence of fracture or other focal bone lesions. Chronic deformity of the coccyx.  IMPRESSION: No acute osseous injury sacrum or coccyx.   Electronically Signed   By: Kathreen Devoid   On: 07/28/2014 19:52   Ct Head Wo Contrast  07/28/2014   CLINICAL DATA:  Pt states she was  stretching in a chair and it broke, causing her to fall back and hot her head on the floor, denies LOC, has ha and posterior neck pain  EXAM: CT HEAD WITHOUT CONTRAST  CT CERVICAL SPINE WITHOUT CONTRAST  TECHNIQUE: Multidetector CT imaging of the head and cervical spine was performed following the standard protocol without intravenous contrast. Multiplanar CT image reconstructions of the cervical spine were also generated.  COMPARISON:  07/24/2013  FINDINGS: CT HEAD FINDINGS  Ventricles normal in size and configuration. No parenchymal masses or mass effect. No evidence of an infarct. There are no extra-axial masses or abnormal fluid collections.  There is no intracranial hemorrhage.  No skull fracture. Visualized sinuses and mastoid air cells are clear.  CT CERVICAL SPINE FINDINGS  No fracture. No spondylolisthesis. Mild loss disc height at C4-C5, C5-C6 and C6-C7, greatest at C5-C6. Small endplate osteophytes are noted at these levels. No significant stenosis. No disc herniation is seen.  Soft tissues are unremarkable.  Lung apices are clear.  IMPRESSION: HEAD CT:  Normal.  CERVICAL CT:  No fracture or acute finding.   Electronically Signed   By: Lajean Manes M.D.   On: 07/28/2014 19:35   Ct Cervical Spine Wo Contrast  07/28/2014   CLINICAL DATA:  Pt states she was stretching in a chair and it broke, causing her to fall back and hot her head on the floor, denies LOC, has ha and posterior neck pain  EXAM: CT HEAD WITHOUT CONTRAST  CT CERVICAL SPINE WITHOUT CONTRAST  TECHNIQUE: Multidetector CT imaging of the head and cervical spine was performed following the standard protocol without intravenous contrast. Multiplanar CT image reconstructions of the cervical spine were also generated.  COMPARISON:  07/24/2013  FINDINGS: CT HEAD FINDINGS  Ventricles normal in size and configuration. No parenchymal masses or mass effect. No evidence of an infarct. There are no extra-axial masses or abnormal fluid collections.   There is no intracranial hemorrhage.  No skull fracture. Visualized sinuses and mastoid air cells are clear.  CT CERVICAL SPINE FINDINGS  No fracture. No spondylolisthesis. Mild loss disc height at C4-C5, C5-C6 and C6-C7, greatest at C5-C6. Small endplate osteophytes are noted at these levels. No significant stenosis. No disc herniation is seen.  Soft tissues are unremarkable.  Lung apices are clear.  IMPRESSION: HEAD CT:  Normal.  CERVICAL CT:  No fracture or acute finding.   Electronically Signed  By: Lajean Manes M.D.   On: 07/28/2014 19:35     EKG Interpretation None      MDM   Final diagnoses:  Fall  Minor head injury, initial encounter  Cervical strain, initial encounter  Lumbar strain, initial encounter    Patient is here after falling off of the chair. Complaining of severe headache, cervical spine pain, lower back pain. Entire spinous tenderness exam. Patient came on a spine board but no no c-collar, just a spine board blocks and the Tylenol. We placed her in C-spine collar, given she had some tenderness in her midline C-spine. Removed from the board using spine precautions. Patient is neurovascularly intact prior and post removal. Will get x-rays, Percocet ordered for pain.  8:38 PM Pt's xrays negative. Pt was given percocet while in ED.  When i went back to the room to discuss results, pt sleeping, hard to arouse. Once she woke up, she requested morphine, stated that "I need morphine shot NOW, i am in too much pain." I explained to her that I already given her a percocet. Her xrays looked good. She will be sore for several days. She should follow up with her doctor if symptoms continue. She ambulated in the hallway with no difficulty. She is stable for dc home. She will be going home on naprosyn, ultram.   Filed Vitals:   07/28/14 1827  BP: 158/94  Pulse: 73  Temp: 99.2 F (37.3 C)  TempSrc: Oral  Resp: 16  SpO2: 99%     Renold Genta, PA-C 07/29/14  0036  Leota Jacobsen, MD 07/30/14 [phone]

## 2014-09-14 ENCOUNTER — Ambulatory Visit: Payer: Medicaid Other | Admitting: Neurology

## 2014-09-21 ENCOUNTER — Encounter: Payer: Self-pay | Admitting: Adult Health

## 2014-09-21 ENCOUNTER — Ambulatory Visit (INDEPENDENT_AMBULATORY_CARE_PROVIDER_SITE_OTHER): Payer: Medicaid Other | Admitting: Adult Health

## 2014-09-21 VITALS — BP 145/95 | HR 89 | Temp 97.3°F | Ht 62.5 in | Wt 218.0 lb

## 2014-09-21 DIAGNOSIS — R413 Other amnesia: Secondary | ICD-10-CM | POA: Diagnosis not present

## 2014-09-21 DIAGNOSIS — G894 Chronic pain syndrome: Secondary | ICD-10-CM | POA: Diagnosis not present

## 2014-09-21 DIAGNOSIS — M797 Fibromyalgia: Secondary | ICD-10-CM

## 2014-09-21 NOTE — Progress Notes (Signed)
PATIENT: Donna Coleman DOB: [birth year]  REASON FOR VISIT: follow up- diffuse pain, fibromyalgia HISTORY FROM: patient  HISTORY OF PRESENT ILLNESS: Donna Coleman is a 44 year old female with a history of diffuse pain and fibromyalgia. She returns today for follow-up. The patient has followed through [facility]. She reports that she continues to have pain all over. In the past she has tried epidural steroid injections in the neck and lower back without benefit. The patient is on multiple medications such as Soma, Flexeril, Neurontin, Lidoderm, Percocet and indomethacin for her pain. In the past the patient has reported some memory issues. However a neuropsychological evaluation did not show an organic dementia. She returns today complaining of diffuse pain all over.  HISTORY 03/08/14 (WILLIS): Donna Coleman is a 44 year old right-handed black female with a history of diffuse pain, fibromyalgia. The patient reports a memory disturbance. The patient has undergone a formal neuropsychological evaluation, and no evidence of an organic dementia was found. The patient was felt to have a lot of overlying anxiety and depression. The patient has a diffusely positive review of systems, and a lot of somatic complaints. The patient has dramatic pain displays, and she currently is followed through the [facility], and she is planning on epidural injections of the neck and low back. The patient is on Gralise taking 1200 mg daily, and she is on amitriptyline, she believes at 25 mg at night. The patient is on a low-dose fentanyl patch daily. She returns for an evaluation.  REVIEW OF SYSTEMS: Out of a complete 14 system review of symptoms, the patient complains only of the following symptoms, and all other reviewed systems are negative.  Activity change, appetite change, chills, fatigue, fever, unexpected weight change, ringing in ears, trouble swallowing, light sensitivity, eye pain, blurred vision,  shortness of breath, choking, chest pain, leg swelling, palpitations, cold intolerance, heat intolerance, excessive thirst, constipation, diarrhea, nausea, restless leg, daytime sleepiness, snoring, sleep talking, acting out dreams, incontinence of bladder, frequency of urination, joint pain, joint swelling, back pain, aching muscles, muscle cramps, walking difficulty, neck pain, neck stiffness, itching, bruise/bleed easily, memory loss, dizziness, headache, numbness, speech difficulty, weakness, tremors, facial droop, agitation, behavioral problems, confusion, decreased concentration, depression, nervous/anxious    ALLERGIES: No Known Allergies  HOME MEDICATIONS: Outpatient Prescriptions Prior to Visit  Medication Sig Dispense Refill  . butalbital-acetaminophen-caffeine (FIORICET) 50-325-40 MG per tablet Take 1 tablet by mouth 4 (four) times daily as needed for headache.    . carisoprodol (SOMA) 350 MG tablet Take 350 mg by mouth 3 (three) times daily as needed for muscle spasms.    . Cholecalciferol (RA VITAMIN D-3) 1000 UNITS tablet Take 1,000 Units by mouth daily.    . cyclobenzaprine (FLEXERIL) 10 MG tablet Take 10 mg by mouth 3 (three) times daily as needed for muscle spasms.    . cycloSPORINE (RESTASIS) 0.05 % ophthalmic emulsion Place 1 drop into both eyes 2 (two) times daily as needed (for eye irritation).     . gabapentin (NEURONTIN) 300 MG capsule Take 300 mg by mouth 2 (two) times daily.    . indomethacin (INDOCIN) 50 MG capsule Take 50 mg by mouth 3 (three) times daily with meals.    . lidocaine (LIDODERM) 5 % Place 1 patch onto the skin daily. Remove & Discard patch within 12 hours or as directed by MD (Patient not taking: Reported on 05/28/2014) 30 patch 0  . lisinopril-hydrochlorothiazide (PRINZIDE,ZESTORETIC) 10-12.5 MG per tablet Take 1 tablet by mouth  daily.    . metoprolol tartrate (LOPRESSOR) 25 MG tablet Take 25 mg by mouth daily.    . naproxen (NAPROSYN) 500 MG tablet Take  1 tablet (500 mg total) by mouth 2 (two) times daily. 30 tablet 0  . oxyCODONE-acetaminophen (PERCOCET/ROXICET) 5-325 MG per tablet Take 1-2 tablets by mouth every 6 (six) hours as needed for severe pain. 20 tablet 0  . pravastatin (PRAVACHOL) 20 MG tablet Take 20 mg by mouth at bedtime.    . predniSONE (DELTASONE) 20 MG tablet 3 tabs po daily x 3 days, then 2 tabs x 3 days, then 1.5 tabs x 3 days, then 1 tab x 3 days, then 0.5 tabs x 3 days (Patient not taking: Reported on 05/28/2014) 27 tablet 0  . ranitidine (ZANTAC) 300 MG tablet Take 1 tablet (300 mg total) by mouth at bedtime. (Patient taking differently: Take 300 mg by mouth 3 times/day as needed-between meals & bedtime for heartburn. ) 30 tablet 3  . traMADol (ULTRAM) 50 MG tablet Take 1 tablet (50 mg total) by mouth every 6 (six) hours as needed. 15 tablet 0   No facility-administered medications prior to visit.    PAST MEDICAL HISTORY: Past Medical History  Diagnosis Date  . Arthritis     neck, central lumbar area  . Neuropathy   . Hypertension   . Cervical spine disease 10/2012  . RP (retinitis pigmentosa)   . Fibroid tumor in breast  . Chronic pain syndrome   . Depression   . Vitamin D deficiency   . Myalgia and myositis, unspecified 04/07/2013  . Obesity   . Headache(784.0)   . Dyslipidemia   . Fibromyalgia   . Memory deficits 08/11/2013  . Osteoarthritis     PAST SURGICAL HISTORY: Past Surgical History  Procedure Laterality Date  . Tubal ligation      FAMILY HISTORY: Family History  Problem Relation Age of Onset  . Diabetes Maternal Grandmother   . Alzheimer's disease Maternal Grandmother     SOCIAL HISTORY: History   Social History  . Marital Status: Single    Spouse Name: N/A  . Number of Children: 2  . Years of Education: college   Occupational History  . 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 Smoking status: Former Smoker -- 0.33 packs/day for 12 years    Types:  Cigarettes    Quit date: 06/30/2010  . Smokeless tobacco: Never Used     Comment: 1pack per 3 days.   . Alcohol Use: No  . Drug Use: No  . Sexual Activity: Not on file   Other Topics Concern  . Not on file   Social History Narrative      PHYSICAL EXAM  Filed Vitals:   09/21/14 0806  BP: 145/95  Pulse: 89  Temp: 97.3 F (36.3 C)  Height: 5' 2.5" (1.588 m)  Weight: 218 lb (98.884 kg)   Body mass index is 39.21 kg/(m^2).  Generalized: Well developed, in no acute distress   Neurological examination  Mentation: Alert oriented to time, place, history taking. Follows all commands speech and language fluent Cranial nerve II-XII: Pupils were equal round reactive to light. Extraocular movements were full, visual field were full on confrontational test. Facial sensation and strength were normal. Uvula tongue midline. Head turning and shoulder shrug  were normal and symmetric. Motor: The motor testing reveals 5 over 5 strength of all 4 extremities. Give away weakness noted in bilateral legs.  Pain just when touching the patient. Good symmetric motor tone is noted throughout.  Sensory: Sensory testing is intact to soft touch on all 4 extremities. Pain to touch. No evidence of extinction is noted.  Coordination: Cerebellar testing reveals good finger-nose-finger and heel-to-shin bilaterally.  Gait and station: Uses a cane to ambulate. Tandem gait not attempted..  Reflexes: Deep tendon reflexes are symmetric and normal bilaterally.   DIAGNOSTIC DATA (LABS, IMAGING, TESTING) - I reviewed patient records, labs, notes, testing and imaging myself where available.     ASSESSMENT AND PLAN 44 y.o. year old female  has a past medical history of Arthritis; Neuropathy; Hypertension; Cervical spine disease (10/2012); RP (retinitis pigmentosa); Fibroid tumor (in breast); Chronic pain syndrome; Depression; Vitamin D deficiency; Myalgia and myositis, unspecified (04/07/2013); Obesity; Headache(784.0);  Dyslipidemia; Fibromyalgia; Memory deficits (08/11/2013); and Osteoarthritis.  1. Fibromyalgia 2. Chronic brain syndrome 3. Memory disturbance  Patient continues to have diffuse pain. She is followed by a pain center. Her memory has remained stable. MMSE 28/30. I have advised the patient to continue to follow with her pain management specialist. At this time I'm not sure that we can offer any additional treatment. She can follow with [place] on an as-needed basis.   Ward Givens, MSN, NP-C 09/21/2014, 8:02 AM [facility] [address] [place], [place] [address] [phone]  Note: This document was prepared with digital dictation and possible smart phrase technology. Any transcriptional errors that result from this process are unintentional.

## 2014-09-21 NOTE — Progress Notes (Signed)
I have read the note, and I agree with the clinical assessment and plan.  Donna Coleman,Donna Coleman   

## 2014-09-21 NOTE — Patient Instructions (Addendum)
Follow-up with your pain management specialist  Try doing light exercise daily.

## 2015-03-02 ENCOUNTER — Emergency Department (HOSPITAL_COMMUNITY): Payer: Medicaid Other

## 2015-03-02 ENCOUNTER — Emergency Department (HOSPITAL_COMMUNITY)
Admission: EM | Admit: 2015-03-02 | Discharge: 2015-03-02 | Disposition: A | Discharge status: Home / Self Care | Payer: Medicaid Other | Attending: Emergency Medicine | Admitting: Emergency Medicine

## 2015-03-02 ENCOUNTER — Encounter (HOSPITAL_COMMUNITY): Payer: Self-pay | Admitting: Emergency Medicine

## 2015-03-02 DIAGNOSIS — F329 Major depressive disorder, single episode, unspecified: Secondary | ICD-10-CM | POA: Insufficient documentation

## 2015-03-02 DIAGNOSIS — I10 Essential (primary) hypertension: Secondary | ICD-10-CM | POA: Insufficient documentation

## 2015-03-02 DIAGNOSIS — Z791 Long term (current) use of non-steroidal anti-inflammatories (NSAID): Secondary | ICD-10-CM | POA: Insufficient documentation

## 2015-03-02 DIAGNOSIS — E785 Hyperlipidemia, unspecified: Secondary | ICD-10-CM | POA: Insufficient documentation

## 2015-03-02 DIAGNOSIS — Z3202 Encounter for pregnancy test, result negative: Secondary | ICD-10-CM | POA: Insufficient documentation

## 2015-03-02 DIAGNOSIS — G8929 Other chronic pain: Secondary | ICD-10-CM

## 2015-03-02 DIAGNOSIS — M158 Other polyosteoarthritis: Secondary | ICD-10-CM | POA: Insufficient documentation

## 2015-03-02 DIAGNOSIS — G894 Chronic pain syndrome: Secondary | ICD-10-CM

## 2015-03-02 DIAGNOSIS — M791 Myalgia, unspecified site: Secondary | ICD-10-CM

## 2015-03-02 DIAGNOSIS — Z79899 Other long term (current) drug therapy: Secondary | ICD-10-CM | POA: Insufficient documentation

## 2015-03-02 DIAGNOSIS — W19XXXA Unspecified fall, initial encounter: Secondary | ICD-10-CM

## 2015-03-02 DIAGNOSIS — E559 Vitamin D deficiency, unspecified: Secondary | ICD-10-CM | POA: Insufficient documentation

## 2015-03-02 DIAGNOSIS — Z86018 Personal history of other benign neoplasm: Secondary | ICD-10-CM | POA: Insufficient documentation

## 2015-03-02 DIAGNOSIS — M5440 Lumbago with sciatica, unspecified side: Secondary | ICD-10-CM | POA: Insufficient documentation

## 2015-03-02 DIAGNOSIS — Z87891 Personal history of nicotine dependence: Secondary | ICD-10-CM | POA: Insufficient documentation

## 2015-03-02 DIAGNOSIS — E669 Obesity, unspecified: Secondary | ICD-10-CM | POA: Insufficient documentation

## 2015-03-02 LAB — POC URINE PREG, ED: PREG TEST UR: NEGATIVE

## 2015-03-02 MED ORDER — HYDROMORPHONE HCL 1 MG/ML IJ SOLN
1.0000 mg | Freq: Once | INTRAMUSCULAR | Status: AC
  Administered 2015-03-02: 1 mg via INTRAMUSCULAR
  Filled 2015-03-02: qty 1

## 2015-03-02 MED ORDER — METHOCARBAMOL 500 MG PO TABS
500.0000 mg | ORAL_TABLET | Freq: Once | ORAL | Status: AC
  Administered 2015-03-02: 500 mg via ORAL
  Filled 2015-03-02: qty 1

## 2015-03-02 MED ORDER — METHOCARBAMOL 1000 MG/10ML IJ SOLN
1000.0000 mg | Freq: Once | INTRAMUSCULAR | Status: DC
  Filled 2015-03-02: qty 10

## 2015-03-02 NOTE — ED Notes (Signed)
Patient transported to X-ray 

## 2015-03-02 NOTE — ED Notes (Signed)
Pt reports lower back pain for several months that has been worse the past week with sharp pain radiating down both legs. Pt ambulatory with a cane.

## 2015-03-02 NOTE — Discharge Instructions (Signed)
1. Medications: usual home medications 2. Treatment: rest, drink plenty of fluids, gentle stretching as discussed, alternate ice and heat 3. Follow Up: Please followup with your primary doctor in 3 days for discussion of your diagnoses and further evaluation after today's visit; if you do not have a primary care doctor use the resource guide provided to find one;  Return to the ER for worsening back pain, difficulty walking, loss of bowel or bladder control or other concerning symptoms    Chronic Back Pain  When back pain lasts longer than 3 months, it is called chronic back pain.People with chronic back pain often go through certain periods that are more intense (flare-ups).  CAUSES Chronic back pain can be caused by wear and tear (degeneration) on different structures in your back. These structures include:  The bones of your spine (vertebrae) and the joints surrounding your spinal cord and nerve roots (facets).  The strong, fibrous tissues that connect your vertebrae (ligaments). Degeneration of these structures may result in pressure on your nerves. This can lead to constant pain. HOME CARE INSTRUCTIONS  Avoid bending, heavy lifting, prolonged sitting, and activities which make the problem worse.  Take brief periods of rest throughout the day to reduce your pain. Lying down or standing usually is better than sitting while you are resting.  Take over-the-counter or prescription medicines only as directed by your caregiver. SEEK IMMEDIATE MEDICAL CARE IF:   You have weakness or numbness in one of your legs or feet.  You have trouble controlling your bladder or bowels.  You have nausea, vomiting, abdominal pain, shortness of breath, or fainting. Document Released: 07/24/2004 Document Revised: 09/08/2011 Document Reviewed: 05/31/2011 [facility] Patient Information 2015 [place], [place]. This information is not intended to replace advice given to you by your health care provider. Make sure  you discuss any questions you have with your health care provider.

## 2015-03-02 NOTE — ED Provider Notes (Signed)
CSN: [id number]     Arrival date & time 03/02/15  0804 History   First MD Initiated Contact with Patient 03/02/15 0827     Chief Complaint  Patient presents with  . Back Pain     (Consider location/radiation/quality/duration/timing/severity/associated sxs/prior Treatment) Patient is a 44 y.o. female presenting with back pain. The history is provided by the patient and medical records. No language interpreter was used.  Back Pain Associated symptoms: no abdominal pain, no chest pain, no dysuria, no fever, no headaches, no numbness and no weakness      Donna Coleman is a 44 y.o. female  with a hx of arthritis, chronic back pain 2/2 DDD, chronic pain syndrome myalgia, fibromyalgia presents to the Emergency Department complaining of gradual, persistent, progressively worsening body pain worst in the back worsening in the last 2 days.  Pt reports she was taking oxycodone 60mg  PO TID but she has been out of it since 02/19/15.  She reports she has an appointment with the pain clinic in 4 days.  Pt was taking morphine as well, but she was taken off this when she was placed on the oxycodone.  Associated symptoms include low back pain that radiates into her legs.  Pt denies loss of bowel or bladder control, sensation deficits, new gait abnormality, saddle anesthesia.  Pt reports intermittent falls due to pain.  Her last fall was 1 week ago.  Pt has been taking naprosyn, but denies other treatments.  She is taking Lyrica.  Walking makes the symptoms worse, nothing makes it better.   Pt denies fever, chills, headache, chest pain, SOB, abd pain, N/V/D, weakness, dizziness, syncope, dysuria.       Past Medical History  Diagnosis Date  . Arthritis     neck, central lumbar area  . Neuropathy   . Hypertension   . Cervical spine disease 10/2012  . RP (retinitis pigmentosa)   . Fibroid tumor in breast  . Chronic pain syndrome   . Depression   . Vitamin D deficiency   . Myalgia and myositis, unspecified  04/07/2013  . Obesity   . Headache(784.0)   . Dyslipidemia   . Fibromyalgia   . Memory deficits 08/11/2013  . Osteoarthritis    Past Surgical History  Procedure Laterality Date  . Tubal ligation     Family History  Problem Relation Age of Onset  . Diabetes Maternal Grandmother   . Alzheimer's disease Maternal Grandmother    Social History  Substance Use Topics  . Smoking status: Former Smoker -- 0.33 packs/day for 12 years    Types: Cigarettes    Quit date: 06/30/2010  . Smokeless tobacco: Never Used     Comment: 1pack per 3 days.   . Alcohol Use: No   OB History    No data available     Review of Systems  Constitutional: Negative for fever, diaphoresis, appetite change, fatigue and unexpected weight change.  HENT: Negative for mouth sores.   Eyes: Negative for visual disturbance.  Respiratory: Negative for cough, chest tightness, shortness of breath and wheezing.   Cardiovascular: Negative for chest pain.  Gastrointestinal: Negative for nausea, vomiting, abdominal pain, diarrhea and constipation.  Endocrine: Negative for polydipsia, polyphagia and polyuria.  Genitourinary: Negative for dysuria, urgency, frequency and hematuria.  Musculoskeletal: Positive for back pain and gait problem ( 2/2 pain). Negative for joint swelling, neck pain and neck stiffness.  Skin: Negative for rash.  Allergic/Immunologic: Negative for immunocompromised state.  Neurological: Negative for syncope, weakness,  light-headedness, numbness and headaches.  Hematological: Does not bruise/bleed easily.  Psychiatric/Behavioral: Negative for sleep disturbance. The patient is not nervous/anxious.   All other systems reviewed and are negative.     Allergies  Review of patient's allergies indicates no known allergies.  Home Medications   Prior to Admission medications   Medication Sig Start Date End Date Taking? Authorizing Provider  ABILIFY 10 MG tablet Take 10 mg by mouth daily. 01/15/15  Yes  Historical Provider, MD  acetaminophen-codeine (TYLENOL #3) 300-30 MG per tablet Take 1 tablet by mouth every 6 (six) hours as needed. 02/07/15  Yes Historical Provider, MD  ALPRAZolam Duanne Moron) 1 MG tablet 1 mg daily as needed.  02/17/15  Yes Historical Provider, MD  amitriptyline (ELAVIL) 25 MG tablet Take 75 mg by mouth daily. 30 day supply given on 01/08/15 01/08/15  Yes Historical Provider, MD  busPIRone (BUSPAR) 15 MG tablet 15 mg 3 (three) times daily.  01/08/15  Yes Historical Provider, MD  butalbital-acetaminophen-caffeine (FIORICET) 50-325-40 MG per tablet Take 1 tablet by mouth 4 (four) times daily as needed for headache.   Yes Historical Provider, MD  carisoprodol (SOMA) 350 MG tablet Take 350 mg by mouth 3 (three) times daily as needed for muscle spasms.   Yes Historical Provider, MD  Cholecalciferol (RA VITAMIN D-3) 1000 UNITS tablet Take 1,000 Units by mouth daily.   Yes Historical Provider, MD  cycloSPORINE (RESTASIS) 0.05 % ophthalmic emulsion Place 1 drop into both eyes 2 (two) times daily as needed (for eye irritation).    Yes Historical Provider, MD  diclofenac sodium (VOLTAREN) 1 % GEL Apply topically 4 (four) times daily. Apply to joints as needed   Yes Historical Provider, MD  FLUoxetine HCl 60 MG TABS Take 1 tablet by mouth at bedtime. Take one tablet by mouth once daily at bedtime for axiety 01/12/15  Yes Historical Provider, MD  lisinopril (PRINIVIL,ZESTRIL) 30 MG tablet  01/08/15  Yes Historical Provider, MD  LYRICA 150 MG capsule Take 450 mg by mouth daily. 01/10/15  Yes Historical Provider, MD  metoprolol tartrate (LOPRESSOR) 25 MG tablet Take 25 mg by mouth daily.   Yes Historical Provider, MD  naproxen (NAPROSYN) 500 MG tablet Take 500 mg by mouth 2 (two) times daily. 12/14/14  Yes Historical Provider, MD  pravastatin (PRAVACHOL) 20 MG tablet Take 20 mg by mouth at bedtime.   Yes Historical Provider, MD  ranitidine (ZANTAC) 300 MG tablet Take 1 tablet by mouth 2 (two) times daily.  01/08/15  Yes Historical Provider, MD  traZODone (DESYREL) 50 MG tablet Take 1-2 tablets by mouth at bedtime as needed. 01/08/15  Yes Historical Provider, MD  Vitamin D, Ergocalciferol, (DRISDOL) 50000 UNITS CAPS capsule  01/08/15  Yes Historical Provider, MD  indomethacin (INDOCIN) 50 MG capsule Take 50 mg by mouth 3 (three) times daily with meals.    Historical Provider, MD  lidocaine (LIDODERM) 5 % Place 1 patch onto the skin daily. Remove & Discard patch within 12 hours or as directed by MD Patient not taking: Reported on 03/02/2015 05/07/14   Delos Haring, PA-C  predniSONE (DELTASONE) 20 MG tablet 3 tabs po daily x 3 days, then 2 tabs x 3 days, then 1.5 tabs x 3 days, then 1 tab x 3 days, then 0.5 tabs x 3 days Patient not taking: Reported on 03/02/2015 10/30/13   [name], MD   BP 161/87 mmHg  Pulse 72  Temp(Src) 98.6 F (37 C) (Oral)  Resp 24  SpO2  100%  LMP 02/23/2015 (Approximate) Physical Exam  Constitutional: She appears well-developed and well-nourished. No distress.  HENT:  Head: Normocephalic and atraumatic.  Mouth/Throat: Oropharynx is clear and moist. No oropharyngeal exudate.  Eyes: Conjunctivae are normal.  Neck: Normal range of motion. Neck supple.  Full ROM without pain  Cardiovascular: Normal rate, regular rhythm, normal heart sounds and intact distal pulses.   Pulmonary/Chest: Effort normal and breath sounds normal. No respiratory distress. She has no wheezes.  Abdominal: Soft. She exhibits no distension. There is no tenderness.  Musculoskeletal:  Full range of motion of the T-spine and L-spine No tenderness to palpation of the spinous processes of the T-spine Tenderness to palpation of the spinous processes of the L-spine without deformity or step-off Tenderness to palpation of the paraspinous muscles of the L-spine  Lymphadenopathy:    She has no cervical adenopathy.  Neurological: She is alert. She has normal reflexes.  Reflex Scores:      Bicep  reflexes are 2+ on the right side and 2+ on the left side.      Brachioradialis reflexes are 2+ on the right side and 2+ on the left side.      Patellar reflexes are 2+ on the right side and 2+ on the left side.      Achilles reflexes are 2+ on the right side and 2+ on the left side. Speech is clear and goal oriented, follows commands Normal 5/5 strength in upper and lower extremities bilaterally including dorsiflexion and plantar flexion, strong and equal grip strength Sensation normal to light and sharp touch Moves extremities without ataxia, coordination intact Normal gait Normal balance No Clonus   Skin: Skin is warm and dry. No rash noted. She is not diaphoretic. No erythema.  Psychiatric: She has a normal mood and affect. Her behavior is normal.  Nursing note and vitals reviewed.   ED Course  Procedures (including critical care time) Labs Review Labs Reviewed  POC URINE PREG, ED    Imaging Review Dg Lumbar Spine Complete  03/02/2015   CLINICAL DATA:  Chronic but worsening lumbago  EXAM: LUMBAR SPINE - COMPLETE 4+ VIEW  COMPARISON:  July 28, 2014  FINDINGS: Frontal, lateral, spot lumbosacral lateral, and bilateral oblique views were obtained. There are 5 non-rib-bearing lumbar type vertebral bodies. There is lumbar dextroscoliosis. There is no fracture or spondylolisthesis. Disc narrowing, mild, at L4-5 and L5-S1 is stable. Other disc spaces appear within normal limits. There is no appreciable facet arthropathy.  IMPRESSION: Mild narrowing at L4-5 and L5-S1 is stable. No fracture or spondylolisthesis. There is scoliosis. No appreciable change is appreciable compared to prior study.   Electronically Signed   By: Lowella Grip III M.D.   On: 03/02/2015 10:02   Dg Hips Bilat With Pelvis Min 5 Views  03/02/2015   CLINICAL DATA:  Fall. Worsening bilateral hip pain and low back pain.  EXAM: DG HIP (WITH OR WITHOUT PELVIS) 5+V BILAT  COMPARISON:  07/28/2014  FINDINGS: The pelvic bony  ring is intact. Symmetric appearance of the sacroiliac joints. Bilateral hips are located without a fracture. Normal appearance of the pubic symphysis.  IMPRESSION: No acute bone abnormality.   Electronically Signed   By: Markus Daft M.D.   On: 03/02/2015 10:03   I have personally reviewed and evaluated these images and lab results as part of my medical decision-making.   EKG Interpretation None      MDM   Final diagnoses:  Midline low back pain with sciatica, sciatica  laterality unspecified  Chronic pain  Myalgia  Chronic pain syndrome   Donna Coleman presents with acute exacerbation of her chronic back pain.  Patient with back pain.  No neurological deficits and normal neuro exam.  Patient can walk but states is painful.  No loss of bowel or bladder control.  No concern for cauda equina.  No fever, night sweats, weight loss, h/o cancer, IVDU.  RICE protocol and pain medicine indicated and discussed with patient.   BP 161/87 mmHg  Pulse 72  Temp(Src) 98.6 F (37 C) (Oral)  Resp 24  SpO2 100%  LMP 02/23/2015 (Approximate)     [facility], PA-C 03/02/15 [place], DO 03/02/15 1430

## 2015-06-10 ENCOUNTER — Encounter (HOSPITAL_COMMUNITY): Payer: Self-pay

## 2015-06-10 ENCOUNTER — Emergency Department (HOSPITAL_COMMUNITY)
Admission: EM | Admit: 2015-06-10 | Discharge: 2015-06-10 | Disposition: A | Discharge status: Home / Self Care | Payer: Medicaid Other | Source: Home / Self Care | Attending: Family Medicine | Admitting: Family Medicine

## 2015-06-10 DIAGNOSIS — M542 Cervicalgia: Secondary | ICD-10-CM

## 2015-06-10 DIAGNOSIS — M5440 Lumbago with sciatica, unspecified side: Secondary | ICD-10-CM

## 2015-06-10 DIAGNOSIS — M791 Myalgia, unspecified site: Secondary | ICD-10-CM

## 2015-06-10 DIAGNOSIS — I10 Essential (primary) hypertension: Secondary | ICD-10-CM

## 2015-06-10 DIAGNOSIS — M609 Myositis, unspecified: Secondary | ICD-10-CM

## 2015-06-10 MED ORDER — OXYCODONE HCL 15 MG PO TABS: 15.0000 mg | ORAL_TABLET | ORAL | Status: DC | PRN

## 2015-06-10 NOTE — Discharge Instructions (Signed)
It was nice meeting you today. I am sorry about your pain. I will give few days supply of your pain medicine, please see your pain doctor or PCP soon for further management. Also remember to take your BP medications today. See [place] as needed.   Back Exercises If you have pain in your back, do these exercises 2-3 times each day or as told by your doctor. When the pain goes away, do the exercises once each day, but repeat the steps more times for each exercise (do more repetitions). If you do not have pain in your back, do these exercises once each day or as told by your doctor. EXERCISES Single Knee to Chest Do these steps 3-5 times in a row for each leg: 1. Lie on your back on a firm bed or the floor with your legs stretched out. 2. Bring one knee to your chest. 3. Hold your knee to your chest by grabbing your knee or thigh. 4. Pull on your knee until you feel a gentle stretch in your lower back. 5. Keep doing the stretch for 10-30 seconds. 6. Slowly let go of your leg and straighten it. Pelvic Tilt Do these steps 5-10 times in a row: 1. Lie on your back on a firm bed or the floor with your legs stretched out. 2. Bend your knees so they point up to the ceiling. Your feet should be flat on the floor. 3. Tighten your lower belly (abdomen) muscles to press your lower back against the floor. This will make your tailbone point up to the ceiling instead of pointing down to your feet or the floor. 4. Stay in this position for 5-10 seconds while you gently tighten your muscles and breathe evenly. Cat-Cow Do these steps until your lower back bends more easily: 1. Get on your hands and knees on a firm surface. Keep your hands under your shoulders, and keep your knees under your hips. You may put padding under your knees. 2. Let your head hang down, and make your tailbone point down to the floor so your lower back is round like the back of a cat. 3. Stay in this position for 5 seconds. 4. Slowly lift  your head and make your tailbone point up to the ceiling so your back hangs low (sags) like the back of a cow. 5. Stay in this position for 5 seconds. Press-Ups Do these steps 5-10 times in a row: 1. Lie on your belly (face-down) on the floor. 2. Place your hands near your head, about shoulder-width apart. 3. While you keep your back relaxed and keep your hips on the floor, slowly straighten your arms to raise the top half of your body and lift your shoulders. Do not use your back muscles. To make yourself more comfortable, you may change where you place your hands. 4. Stay in this position for 5 seconds. 5. Slowly return to lying flat on the floor. Bridges Do these steps 10 times in a row: 1. Lie on your back on a firm surface. 2. Bend your knees so they point up to the ceiling. Your feet should be flat on the floor. 3. Tighten your butt muscles and lift your butt off of the floor until your waist is almost as high as your knees. If you do not feel the muscles working in your butt and the back of your thighs, slide your feet 1-2 inches farther away from your butt. 4. Stay in this position for 3-5 seconds. 5. Slowly  lower your butt to the floor, and let your butt muscles relax. If this exercise is too easy, try doing it with your arms crossed over your chest. Belly Crunches Do these steps 5-10 times in a row: 1. Lie on your back on a firm bed or the floor with your legs stretched out. 2. Bend your knees so they point up to the ceiling. Your feet should be flat on the floor. 3. Cross your arms over your chest. 4. Tip your chin a little bit toward your chest but do not bend your neck. 5. Tighten your belly muscles and slowly raise your chest just enough to lift your shoulder blades a tiny bit off of the floor. 6. Slowly lower your chest and your head to the floor. Back Lifts Do these steps 5-10 times in a row: 1. Lie on your belly (face-down) with your arms at your sides, and rest your  forehead on the floor. 2. Tighten the muscles in your legs and your butt. 3. Slowly lift your chest off of the floor while you keep your hips on the floor. Keep the back of your head in line with the curve in your back. Look at the floor while you do this. 4. Stay in this position for 3-5 seconds. 5. Slowly lower your chest and your face to the floor. GET HELP IF:  Your back pain gets a lot worse when you do an exercise.  Your back pain does not lessen 2 hours after you exercise. If you have any of these problems, stop doing the exercises. Do not do them again unless your doctor says it is okay. GET HELP RIGHT AWAY IF:  You have sudden, very bad back pain. If this happens, stop doing the exercises. Do not do them again unless your doctor says it is okay.   This information is not intended to replace advice given to you by your health care provider. Make sure you discuss any questions you have with your health care provider.   Document Released: 07/19/2010 Document Revised: 03/07/2015 Document Reviewed: 08/10/2014 Elsevier Interactive Patient Education [employer].

## 2015-06-10 NOTE — ED Notes (Signed)
Patient complains of neck pain and back pain Patient has a history of DDD and arthritis Currently out of her pain medication

## 2015-06-10 NOTE — ED Provider Notes (Signed)
CSN: [id number]     Arrival date & time 06/10/15  1401 History   First MD Initiated Contact with Patient 06/10/15 1523     Chief Complaint  Patient presents with  . Neck Pain   (Consider location/radiation/quality/duration/timing/severity/associated sxs/prior Treatment) HPI  Joint Pain:Patient here for generalized body ache. Low back pain, neck pain. She stated she has issues with pain for years and was been managed at the pain clinic center. Recently her insurance coverage was interrupted hence she was not able to follow up at the pain clinic. She came in with an empty bottle of oxycodone 15 mg which she normally use, she has been out of it for 6 days.  Her pain now is more than 10/10 in severity. She denies recent fall, no sensory loss, no numbness or tinglings of her limbs but she stated her back pain shoots down her legs. HTN: She did not take her BP medication today. She is in too much pain to remember her antihypertensive agent.  Past Medical History  Diagnosis Date  . Arthritis     neck, central lumbar area  . Neuropathy ([place])   . Hypertension   . Cervical spine disease 10/2012  . RP (retinitis pigmentosa)   . Fibroid tumor in breast  . Chronic pain syndrome   . Depression   . Vitamin D deficiency   . Myalgia and myositis, unspecified 04/07/2013  . Obesity   . Headache(784.0)   . Dyslipidemia   . Fibromyalgia   . Memory deficits 08/11/2013  . Osteoarthritis    Past Surgical History  Procedure Laterality Date  . Tubal ligation     Family History  Problem Relation Age of Onset  . Diabetes Maternal Grandmother   . Alzheimer's disease Maternal Grandmother    Social History  Substance Use Topics  . Smoking status: Former Smoker -- 0.33 packs/day for 12 years    Types: Cigarettes    Quit date: 06/30/2010  . Smokeless tobacco: Never Used     Comment: 1pack per 3 days.   . Alcohol Use: No   OB History    No data available     Review of Systems  Respiratory:  Negative.   Cardiovascular: Negative.   Gastrointestinal: Negative.   Musculoskeletal: Positive for myalgias, back pain, joint swelling, arthralgias and neck pain. Negative for neck stiffness.  All other systems reviewed and are negative.   Allergies  Review of patient's allergies indicates no known allergies.  Home Medications   Prior to Admission medications   Medication Sig Start Date End Date Taking? Authorizing Provider  ABILIFY 10 MG tablet Take 10 mg by mouth daily. 01/15/15   Historical Provider, MD  acetaminophen-codeine (TYLENOL #3) 300-30 MG per tablet Take 1 tablet by mouth every 6 (six) hours as needed. 02/07/15   Historical Provider, MD  ALPRAZolam Duanne Moron) 1 MG tablet 1 mg daily as needed.  02/17/15   Historical Provider, MD  amitriptyline (ELAVIL) 25 MG tablet Take 75 mg by mouth daily. 30 day supply given on 01/08/15 01/08/15   Historical Provider, MD  busPIRone (BUSPAR) 15 MG tablet 15 mg 3 (three) times daily.  01/08/15   Historical Provider, MD  butalbital-acetaminophen-caffeine (FIORICET) 50-325-40 MG per tablet Take 1 tablet by mouth 4 (four) times daily as needed for headache.    Historical Provider, MD  carisoprodol (SOMA) 350 MG tablet Take 350 mg by mouth 3 (three) times daily as needed for muscle spasms.    Historical Provider, MD  Cholecalciferol (RA  VITAMIN D-3) 1000 UNITS tablet Take 1,000 Units by mouth daily.    Historical Provider, MD  cycloSPORINE (RESTASIS) 0.05 % ophthalmic emulsion Place 1 drop into both eyes 2 (two) times daily as needed (for eye irritation).     Historical Provider, MD  diclofenac sodium (VOLTAREN) 1 % GEL Apply topically 4 (four) times daily. Apply to joints as needed    Historical Provider, MD  FLUoxetine HCl 60 MG TABS Take 1 tablet by mouth at bedtime. Take one tablet by mouth once daily at bedtime for axiety 01/12/15   Historical Provider, MD  indomethacin (INDOCIN) 50 MG capsule Take 50 mg by mouth 3 (three) times daily with meals.     Historical Provider, MD  lidocaine (LIDODERM) 5 % Place 1 patch onto the skin daily. Remove & Discard patch within 12 hours or as directed by MD Patient not taking: Reported on 03/02/2015 05/07/14   Delos Haring, PA-C  lisinopril (PRINIVIL,ZESTRIL) 30 MG tablet  01/08/15   Historical Provider, MD  LYRICA 150 MG capsule Take 450 mg by mouth daily. 01/10/15   Historical Provider, MD  metoprolol tartrate (LOPRESSOR) 25 MG tablet Take 25 mg by mouth daily.    Historical Provider, MD  naproxen (NAPROSYN) 500 MG tablet Take 500 mg by mouth 2 (two) times daily. 12/14/14   Historical Provider, MD  pravastatin (PRAVACHOL) 20 MG tablet Take 20 mg by mouth at bedtime.    Historical Provider, MD  predniSONE (DELTASONE) 20 MG tablet 3 tabs po daily x 3 days, then 2 tabs x 3 days, then 1.5 tabs x 3 days, then 1 tab x 3 days, then 0.5 tabs x 3 days Patient not taking: Reported on 03/02/2015 10/30/13   [name], MD  ranitidine (ZANTAC) 300 MG tablet Take 1 tablet by mouth 2 (two) times daily. 01/08/15   Historical Provider, MD  traZODone (DESYREL) 50 MG tablet Take 1-2 tablets by mouth at bedtime as needed. 01/08/15   Historical Provider, MD  Vitamin D, Ergocalciferol, (DRISDOL) 50000 UNITS CAPS capsule  01/08/15   Historical Provider, MD   Meds Ordered and Administered this Visit  Medications - No data to display  BP 198/110 mmHg  Pulse 81  Temp(Src) 98.3 F (36.8 C) (Oral)  SpO2 100% No data found.   Physical Exam  Constitutional: She appears well-developed. No distress.  Cardiovascular: Normal rate, regular rhythm, normal heart sounds and intact distal pulses.   No murmur heard. Pulmonary/Chest: Effort normal and breath sounds normal. No respiratory distress. She has no wheezes.  Musculoskeletal:       Right hip: She exhibits tenderness.       Left hip: She exhibits tenderness.       Right knee: Tenderness found.       Left knee: Tenderness found.       Thoracic back: She exhibits tenderness.        Lumbar back: She exhibits tenderness.  Patient seems to ache every where, she will not allow adequate exam. She could not lay flat on the examination table. She is tender all over.  Nursing note and vitals reviewed.   ED Course  Procedures (including critical care time)  Labs Review Labs Reviewed - No data to display  Imaging Review No results found.   Visual Acuity Review  Right Eye Distance:   Left Eye Distance:   Bilateral Distance:    Right Eye Near:   Left Eye Near:    Bilateral Near:  MDM  No diagnosis found. Myositis  Myalgia  Low back pain with sciatica, sciatica laterality unspecified, unspecified back pain laterality  Neck pain  Essential hypertension  I reviewed her record, she has been seen multiple times for myositis, fibromyalgia, back pain. Her medication list suggested she is on Acetomynophen-Codeine but she stated she had not used that in a while. I gave her a short course of Oxycodone and recommended f/u with her PCP or pain doctor soon. She stated her insurance should kick back in in January. Home back exercise instruction given. Return precaution discussed.  Her BP was elevated likely due to non-adherence to his medication. She agreed to take her meds as soon as she gets back home.    Kinnie Feil, MD 06/10/15 [phone]

## 2015-06-15 ENCOUNTER — Ambulatory Visit (INDEPENDENT_AMBULATORY_CARE_PROVIDER_SITE_OTHER): Payer: Self-pay | Admitting: Internal Medicine

## 2015-06-15 ENCOUNTER — Encounter: Payer: Self-pay | Admitting: Internal Medicine

## 2015-06-15 VITALS — BP 153/80 | HR 88 | Temp 98.3°F | Ht 62.5 in | Wt 214.0 lb

## 2015-06-15 DIAGNOSIS — I10 Essential (primary) hypertension: Secondary | ICD-10-CM | POA: Insufficient documentation

## 2015-06-15 DIAGNOSIS — E785 Hyperlipidemia, unspecified: Secondary | ICD-10-CM

## 2015-06-15 DIAGNOSIS — Z79899 Other long term (current) drug therapy: Secondary | ICD-10-CM

## 2015-06-15 DIAGNOSIS — F419 Anxiety disorder, unspecified: Secondary | ICD-10-CM

## 2015-06-15 DIAGNOSIS — F329 Major depressive disorder, single episode, unspecified: Secondary | ICD-10-CM | POA: Insufficient documentation

## 2015-06-15 DIAGNOSIS — G894 Chronic pain syndrome: Secondary | ICD-10-CM

## 2015-06-15 DIAGNOSIS — F418 Other specified anxiety disorders: Secondary | ICD-10-CM

## 2015-06-15 MED ORDER — NAPROXEN 500 MG PO TABS: 500.0000 mg | ORAL_TABLET | Freq: Two times a day (BID) | ORAL | Status: DC

## 2015-06-15 MED ORDER — FLUOXETINE HCL 60 MG PO TABS: 1.0000 | ORAL_TABLET | Freq: Every day | ORAL | Status: DC

## 2015-06-15 MED ORDER — LISINOPRIL-HYDROCHLOROTHIAZIDE 20-12.5 MG PO TABS: 1.0000 | ORAL_TABLET | Freq: Every day | ORAL | Status: DC

## 2015-06-15 NOTE — Assessment & Plan Note (Signed)
Assessment: Patient with Pravastatin 20mg  QHS on medication list.  Does not have this prescription with her today and states she is not taking and denies history of hyperlipidemia.  No prior lipid panel on results review.  Plan: - discontinue pravastatin - check lipids at follow up in 1 month

## 2015-06-15 NOTE — Assessment & Plan Note (Signed)
Assessment: Patient reports anxiety and depression mostly secondary to her chronic pain.  Has previously been seen by psychiatry per her report.  Today, she brings empty bottles of Buspirone, Abilify, Fluoxetine, Trazodone and states has not been taking for many months due to insurance issues.  Plan: - discussed with patient potential for sedation and interaction with some of her psychiatric medications - would strongly consider psychiatry referral in future if patient establishes with [facility] to manage her multiple medications - refilled fluoxetine today to hopefully treat anxiety and depression in conjunction with chronic pain

## 2015-06-15 NOTE — Assessment & Plan Note (Addendum)
Assessment: Patient reports dealing with chronic pain for years and is disabled from this.  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  She has previously been seen by Neurology, Rheumatology, and [facility].  States she no longer follows with Pain Clinic due to loss of Medicaid.   She was seen by [facility] Rheumat[facility] and had negative rheumatologic work-up as source of her pain.  Neurology saw her in the past and noted similar findings on physical exam such as being disproportionately tender to very light touch.  At one time during encounter today, patient stood up from her chair and after a brief period, yelled out in pain stating that her ankle gives out due to the pain she has.   She reports having pain everywhere, but most notably her left knee and ankle, lower back, shoulders, and neck.  She is exquisitely tender to any palpation on exam.  Her ROM is decreased due to pain but no obvious crepitus.  She has had imaging done in the past without significant pathology noted to explain her pain.  Urgent care visit from 12/11 was notable for receiving #30 oxycodone 15mg  tablets. Patient states she is out of these already.  Query of [place] controlled substance database reveals multiple monthly prescriptions filled for #150 oxycodone 15mg  tablets.  Patient also has previous ED visits for pain complaints.  Patient inquired 3 times during encounter as to whether I would be providing her with pain medicine today.  Patient brought many empty prescription bottles today including: Naproxen, Lyrica, Oxycodone, Tizanidine  Plan: - discussed with patient that I did not feel comfortable prescribing her narcotics for chronic pain as there is not literature to support treating chronic pain syndrome/fibromyalgia with opiate narcotics.  Additionally, patient previously notified that [facility] does not prescribe opiates during initial visit for new patients. I told patient  that I would be happy to refer her to Pain Clinic when she gets [employer]. - patient became upset when I would not give her opiates - refill sent for Naproxen 500mg  BID #30 pills

## 2015-06-15 NOTE — Patient Instructions (Signed)
Please follow up in 1 week for a lab visit only to check your blood work.  Please come back to see one of [place] in the clinic in 1 month to recheck your blood pressure and other health maintenance needs.  We can discuss referral to pain clinics and psychiatry at that time.

## 2015-06-15 NOTE — Progress Notes (Signed)
Patient ID: Donna Coleman, female   DOB: 1971/05/10, 44 y.o.   MRN: [id number]   Subjective:   Patient ID: Donna Coleman female   DOB: 1970/07/10 44 y.o.   MRN: [id number]  HPI: [name] is a 44 y.o. female with past medical history as detailed below.  She presents to [facility] today for establishment of care and is accompanied by her 11 yo old son.  Please see A&P for status of patients chronic medical conditions.  Patient brought in multiple empty prescription bottles stating that she needed refills on them as she has not had them filled for many months.  States she lost her insurance (Medicaid) after her son turned 48.  Previously had been a patient of Dr. Jeanie Cooks (PCP) and [facility] in [place].    Past Medical History  Diagnosis Date  . Arthritis     neck, central lumbar area  . Neuropathy ([place])   . Hypertension   . Cervical spine disease 10/2012  . RP (retinitis pigmentosa)   . Fibroid tumor in breast  . Chronic pain syndrome   . Depression   . Vitamin D deficiency   . Myalgia and myositis, unspecified 04/07/2013  . Obesity   . Headache(784.0)   . Dyslipidemia   . Fibromyalgia   . Memory deficits 08/11/2013  . Osteoarthritis    Current Outpatient Prescriptions  Medication Sig Dispense Refill  . ABILIFY 10 MG tablet Take 10 mg by mouth daily.  0  . acetaminophen-codeine (TYLENOL #3) 300-30 MG per tablet Take 1 tablet by mouth every 6 (six) hours as needed.  0  . ALPRAZolam (XANAX) 1 MG tablet 1 mg daily as needed.   0  . amitriptyline (ELAVIL) 25 MG tablet Take 75 mg by mouth daily. 30 day supply given on 01/08/15  1  . busPIRone (BUSPAR) 15 MG tablet 15 mg 3 (three) times daily.   0  . butalbital-acetaminophen-caffeine (FIORICET) 50-325-40 MG per tablet Take 1 tablet by mouth 4 (four) times daily as needed for headache.    . carisoprodol (SOMA) 350 MG tablet Take 350 mg by mouth 3 (three) times daily as needed for muscle spasms.    . Cholecalciferol (RA VITAMIN  D-3) 1000 UNITS tablet Take 1,000 Units by mouth daily.    . cycloSPORINE (RESTASIS) 0.05 % ophthalmic emulsion Place 1 drop into both eyes 2 (two) times daily as needed (for eye irritation).     Marland Kitchen diclofenac sodium (VOLTAREN) 1 % GEL Apply topically 4 (four) times daily. Apply to joints as needed    . FLUoxetine HCl 60 MG TABS Take 1 tablet by mouth at bedtime. Take one tablet by mouth once daily at bedtime for axiety  0  . indomethacin (INDOCIN) 50 MG capsule Take 50 mg by mouth 3 (three) times daily with meals.    . lidocaine (LIDODERM) 5 % Place 1 patch onto the skin daily. Remove & Discard patch within 12 hours or as directed by MD (Patient not taking: Reported on 03/02/2015) 30 patch 0  . lisinopril (PRINIVIL,ZESTRIL) 30 MG tablet   0  . LYRICA 150 MG capsule Take 450 mg by mouth daily.  0  . metoprolol tartrate (LOPRESSOR) 25 MG tablet Take 25 mg by mouth daily.    . naproxen (NAPROSYN) 500 MG tablet Take 500 mg by mouth 2 (two) times daily.  0  . oxyCODONE (ROXICODONE) 15 MG immediate release tablet Take 1 tablet (15 mg total) by mouth every 4 (four)  hours as needed for pain. 30 tablet 0  . pravastatin (PRAVACHOL) 20 MG tablet Take 20 mg by mouth at bedtime.    . predniSONE (DELTASONE) 20 MG tablet 3 tabs po daily x 3 days, then 2 tabs x 3 days, then 1.5 tabs x 3 days, then 1 tab x 3 days, then 0.5 tabs x 3 days (Patient not taking: Reported on 03/02/2015) 27 tablet 0  . ranitidine (ZANTAC) 300 MG tablet Take 1 tablet by mouth 2 (two) times daily.  0  . traZODone (DESYREL) 50 MG tablet Take 1-2 tablets by mouth at bedtime as needed.  0  . Vitamin D, Ergocalciferol, (DRISDOL) 50000 UNITS CAPS capsule   0   No current facility-administered medications for this visit.   Family History  Problem Relation Age of Onset  . Diabetes Maternal Grandmother   . Alzheimer's disease Maternal Grandmother    Social History   Social History  . Marital Status: Single    Spouse Name: N/A  . Number of  Children: 2  . Years of Education: college   Occupational History  . 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 Smoking status: Former Smoker -- 0.33 packs/day for 12 years    Types: Cigarettes    Quit date: 06/30/2010  . Smokeless tobacco: Never Used     Comment: 1pack per 3 days.   . Alcohol Use: No  . Drug Use: No  . Sexual Activity: Not Asked   Other Topics Concern  . None   Social History Narrative   Review of Systems: Review of Systems  Constitutional: Positive for malaise/fatigue. Negative for fever and chills.  HENT: Negative for congestion and sore throat.   Eyes: Negative for blurred vision and pain.  Respiratory: Negative for cough and shortness of breath.   Cardiovascular: Negative for chest pain and leg swelling.  Gastrointestinal: Negative for nausea, vomiting, diarrhea and constipation.  Genitourinary: Negative for dysuria.  Musculoskeletal: Positive for myalgias, back pain, joint pain, falls and neck pain.  Skin: Negative for rash.  Neurological: Positive for tingling and sensory change. Negative for dizziness, focal weakness, seizures, loss of consciousness and headaches.  Psychiatric/Behavioral: Positive for depression. Negative for suicidal ideas and substance abuse. The patient is nervous/anxious and has insomnia.     Objective:  Physical Exam: Filed Vitals:   06/15/15 1527  BP: 153/80  Pulse: 88  Temp: 98.3 F (36.8 C)  TempSrc: Oral  Height: 5' 2.5" (1.588 m)  Weight: 214 lb (97.07 kg)  SpO2: 100%   Physical Exam  Constitutional: She is oriented to person, place, and time. She appears well-developed and well-nourished.  Ambulating with cane  HENT:  Head: Normocephalic and atraumatic.  Eyes: EOM are normal.  Neck:  Patient unable to perform active/passive ROM due to pain.    Cardiovascular: Normal rate and regular rhythm.   Pulmonary/Chest: Effort normal and breath sounds normal.  Musculoskeletal:  Patient  disproportionately tender to pain with very light touch anywhere on her extremities, shoulders, back.  She has decreased strength but difficult to determine amount of effort.  Would not lie on her back due to pain tolerance  Neurological: She is alert and oriented to person, place, and time. No cranial nerve deficit.  Skin: Skin is warm and dry. No erythema.    Assessment & Plan:   Please see Problem List for Assessment and Plan.  Case discussed with Dr. Lynnae January.

## 2015-06-15 NOTE — Assessment & Plan Note (Addendum)
Assessment: Patient with BP today of 153/80, above goal of <140/90.  She has Metoprolol Tartrate 25mg  daily and Lisinopril 30mg  daily listed on her medication list.  However, she brings in an empty pill bottle for Lisinopril-HCTZ 20-12.5mg  daily.  States this is her most recent BP medicine but has also not been taking for months (along with all her other medications) due to insurance issues.  Plan: - Discontinue Metoprolol and Lisinopril - Refill Lisinopril-HCTZ 20-12.5mg  daily - RTC in 1 month for BP check and 1 week for BMP lab order.  Patient became upset with me that I would not fill her pain medicines today and left before being able to print AVS.  I verbally informed her that I would send BP meds to her pharmacy.

## 2015-06-18 ENCOUNTER — Emergency Department (INDEPENDENT_AMBULATORY_CARE_PROVIDER_SITE_OTHER): Payer: Self-pay

## 2015-06-18 ENCOUNTER — Emergency Department (INDEPENDENT_AMBULATORY_CARE_PROVIDER_SITE_OTHER)
Admission: EM | Admit: 2015-06-18 | Discharge: 2015-06-18 | Disposition: A | Discharge status: Home / Self Care | Payer: Self-pay | Source: Home / Self Care

## 2015-06-18 ENCOUNTER — Encounter (HOSPITAL_COMMUNITY): Payer: Self-pay | Admitting: Emergency Medicine

## 2015-06-18 DIAGNOSIS — G8929 Other chronic pain: Secondary | ICD-10-CM

## 2015-06-18 DIAGNOSIS — M25572 Pain in left ankle and joints of left foot: Secondary | ICD-10-CM

## 2015-06-18 DIAGNOSIS — M25569 Pain in unspecified knee: Secondary | ICD-10-CM

## 2015-06-18 MED ORDER — OXYCODONE HCL 15 MG PO TABS: 15.0000 mg | ORAL_TABLET | ORAL | Status: DC | PRN

## 2015-06-18 NOTE — ED Provider Notes (Signed)
CSN: [id number]     Arrival date & time 06/18/15  1311 History   None    Chief Complaint  Patient presents with  . Back Pain  . Ankle Pain   (Consider location/radiation/quality/duration/timing/severity/associated sxs/prior Treatment) HPI History obtained from patient:   LOCATION:left ankle, knee SEVERITY:6 DURATION: ankle over the last 4 days, knee chronic CONTEXT:was walking 4 days ago, sudden sharp pain lateral ankle now swollen QUALITY:ache MODIFYING FACTORS: pain meds but are out of them at this time.  ASSOCIATED SYMPTOMS:chronic arthritic pain TIMING:constant OCCUPATION:  Past Medical History  Diagnosis Date  . Arthritis     neck, central lumbar area  . Neuropathy ([place])   . Hypertension   . Cervical spine disease 10/2012  . RP (retinitis pigmentosa)   . Fibroid tumor in breast  . Chronic pain syndrome   . Depression   . Vitamin D deficiency   . Myalgia and myositis, unspecified 04/07/2013  . Obesity   . Headache(784.0)   . Dyslipidemia   . Fibromyalgia   . Memory deficits 08/11/2013  . Osteoarthritis    Past Surgical History  Procedure Laterality Date  . Tubal ligation     Family History  Problem Relation Age of Onset  . Diabetes Maternal Grandmother   . Alzheimer's disease Maternal Grandmother    Social History  Substance Use Topics  . Smoking status: Former Smoker -- 0.33 packs/day for 12 years    Types: Cigarettes    Quit date: 06/30/2010  . Smokeless tobacco: Never Used     Comment: 1pack per 3 days.   . Alcohol Use: No   OB History    No data available     Review of Systems ROS +'ve left knee and ankle pain  Denies: HEADACHE, NAUSEA, ABDOMINAL PAIN, CHEST PAIN, CONGESTION, DYSURIA, SHORTNESS OF BREATH  Allergies  Review of patient's allergies indicates no known allergies.  Home Medications   Prior to Admission medications   Medication Sig Start Date End Date Taking? Authorizing Provider  ABILIFY 10 MG tablet Take 10 mg by mouth  daily. 01/15/15   Historical Provider, MD  acetaminophen-codeine (TYLENOL #3) 300-30 MG per tablet Take 1 tablet by mouth every 6 (six) hours as needed. 02/07/15   Historical Provider, MD  ALPRAZolam Duanne Moron) 1 MG tablet 1 mg daily as needed.  02/17/15   Historical Provider, MD  amitriptyline (ELAVIL) 25 MG tablet Take 75 mg by mouth daily. 30 day supply given on 01/08/15 01/08/15   Historical Provider, MD  busPIRone (BUSPAR) 15 MG tablet 15 mg 3 (three) times daily.  01/08/15   Historical Provider, MD  butalbital-acetaminophen-caffeine (FIORICET) 50-325-40 MG per tablet Take 1 tablet by mouth 4 (four) times daily as needed for headache.    Historical Provider, MD  carisoprodol (SOMA) 350 MG tablet Take 350 mg by mouth 3 (three) times daily as needed for muscle spasms.    Historical Provider, MD  Cholecalciferol (RA VITAMIN D-3) 1000 UNITS tablet Take 1,000 Units by mouth daily.    Historical Provider, MD  cycloSPORINE (RESTASIS) 0.05 % ophthalmic emulsion Place 1 drop into both eyes 2 (two) times daily as needed (for eye irritation).     Historical Provider, MD  FLUoxetine HCl 60 MG TABS Take 1 tablet by mouth at bedtime. Take one tablet by mouth once daily at bedtime for axiety 06/15/15   Jule Ser, DO  lidocaine (LIDODERM) 5 % Place 1 patch onto the skin daily. Remove & Discard patch within 12 hours or as directed  by MD Patient not taking: Reported on 03/02/2015 05/07/14   Delos Haring, PA-C  lisinopril-hydrochlorothiazide (ZESTORETIC) 20-12.5 MG tablet Take 1 tablet by mouth daily. 06/15/15 06/14/16  Jule Ser, DO  naproxen (NAPROSYN) 500 MG tablet Take 1 tablet (500 mg total) by mouth 2 (two) times daily. 06/15/15   Jule Ser, DO  oxyCODONE (ROXICODONE) 15 MG immediate release tablet Take 1 tablet (15 mg total) by mouth every 4 (four) hours as needed for pain. 06/18/15   Konrad Felix, PA  ranitidine (ZANTAC) 300 MG tablet Take 1 tablet by mouth 2 (two) times daily. 01/08/15   Historical  Provider, MD  Vitamin D, Ergocalciferol, (DRISDOL) 50000 UNITS CAPS capsule  01/08/15   Historical Provider, MD   Meds Ordered and Administered this Visit  Medications - No data to display  BP 174/90 mmHg  Pulse 71  Temp(Src) 97.4 F (36.3 C) (Oral)  SpO2 100%  LMP 06/05/2015 No data found.   Physical Exam  Constitutional: She appears well-developed and well-nourished. No distress.  HENT:  Head: Normocephalic and atraumatic.  Mouth/Throat: Oropharynx is clear and moist.  Musculoskeletal:       Feet:    ED Course  Procedures (including critical care time)  Labs Review Labs Reviewed - No data to display  Imaging Review Dg Ankle Complete Left  06/18/2015  CLINICAL DATA:  Pain developed while walking yesterday. EXAM: LEFT ANKLE COMPLETE - 3+ VIEW COMPARISON:  None. FINDINGS: There is regional soft tissue swelling. No evidence of fracture or dislocation. No degenerative change at the ankle joint demonstrated. Some degree of pes planus. IMPRESSION: No acute finding at the ankle joint. Electronically Signed   By: Nelson Chimes M.D.   On: 06/18/2015 15:01     Visual Acuity Review  Right Eye Distance:   Left Eye Distance:   Bilateral Distance:    Right Eye Near:   Left Eye Near:    Bilateral Near:         MDM   1. Chronic knee pain, unspecified laterality   2. Ankle pain, left   I have reviewed xray with patient. No acute bony changes  Patient is advised to continue home symptomatic treatment. Prescription roxicodone is  provided to the patient. She is also advised that she should not continue to use Urgent Care as her source of medication. She needs to establish with a PCP or back to the pain clinic.  Patient is advised that if there are new or worsening symptoms or attend the emergency department, or contact primary care provider. Instructions of care provided discharged home in stable condition.  THIS NOTE WAS GENERATED USING A VOICE RECOGNITION SOFTWARE  PROGRAM. ALL REASONABLE EFFORTS  WERE MADE TO PROOFREAD THIS DOCUMENT FOR ACCURACY.     Konrad Felix, [place] 06/18/15 [phone]

## 2015-06-18 NOTE — Discharge Instructions (Signed)
Medicine Refill at the Emergency Department We have refilled your medicine today, but it is best for you to get refills through your primary health care provider's office. In the future, please plan ahead so you do not need to get refills from the emergency department. If the medicine we refilled was a maintenance medicine, you may have received only enough to get you by until you are able to see your regular health care provider.   This information is not intended to replace advice given to you by your health care provider. Make sure you discuss any questions you have with your health care provider.   Document Released: 10/03/2003 Document Revised: 07/07/2014 Document Reviewed: 09/23/2013 Elsevier Interactive Patient Education 2016 Elsevier Inc.  Ankle Pain Ankle pain is a common symptom. The bones, cartilage, tendons, and muscles of the ankle joint perform a lot of work each day. The ankle joint holds your body weight and allows you to move around. Ankle pain can occur on either side or back of 1 or both ankles. Ankle pain may be sharp and burning or dull and aching. There may be tenderness, stiffness, redness, or warmth around the ankle. The pain occurs more often when a person walks or puts pressure on the ankle. CAUSES  There are many reasons ankle pain can develop. It is important to work with your caregiver to identify the cause since many conditions can impact the bones, cartilage, muscles, and tendons. Causes for ankle pain include:  Injury, including a break (fracture), sprain, or strain often due to a fall, sports, or a high-impact activity.  Swelling (inflammation) of a tendon (tendonitis).  Achilles tendon rupture.  Ankle instability after repeated sprains and strains.  Poor foot alignment.  Pressure on a nerve (tarsal tunnel syndrome).  Arthritis in the ankle or the lining of the ankle.  Crystal formation in the ankle (gout or pseudogout). DIAGNOSIS  A diagnosis is based  on your medical history, your symptoms, results of your physical exam, and results of diagnostic tests. Diagnostic tests may include X-ray exams or a computerized magnetic scan (magnetic resonance imaging, MRI). TREATMENT  Treatment will depend on the cause of your ankle pain and may include:  Keeping pressure off the ankle and limiting activities.  Using crutches or other walking support (a cane or brace).  Using rest, ice, compression, and elevation.  Participating in physical therapy or home exercises.  Wearing shoe inserts or special shoes.  Losing weight.  Taking medications to reduce pain or swelling or receiving an injection.  Undergoing surgery. HOME CARE INSTRUCTIONS   Only take over-the-counter or prescription medicines for pain, discomfort, or fever as directed by your caregiver.  Put ice on the injured area.  Put ice in a plastic bag.  Place a towel between your skin and the bag.  Leave the ice on for 15-20 minutes at a time, 03-04 times a day.  Keep your leg raised (elevated) when possible to lessen swelling.  Avoid activities that cause ankle pain.  Follow specific exercises as directed by your caregiver.  Record how often you have ankle pain, the location of the pain, and what it feels like. This information may be helpful to you and your caregiver.  Ask your caregiver about returning to work or sports and whether you should drive.  Follow up with your caregiver for further examination, therapy, or testing as directed. SEEK MEDICAL CARE IF:   Pain or swelling continues or worsens beyond 1 week.  You have an oral  temperature above 102 F (38.9 C).  You are feeling unwell or have chills.  You are having an increasingly difficult time with walking.  You have loss of sensation or other new symptoms.  You have questions or concerns. MAKE SURE YOU:   Understand these instructions.  Will watch your condition.  Will get help right away if you are  not doing well or get worse.   This information is not intended to replace advice given to you by your health care provider. Make sure you discuss any questions you have with your health care provider.   Document Released: 12/04/2009 Document Revised: 09/08/2011 Document Reviewed: 01/16/2015 Elsevier Interactive Patient Education 2016 Elsevier Inc.  Chronic Pain Chronic pain can be defined as pain that is off and on and lasts for 3-6 months or longer. Many things cause chronic pain, which can make it difficult to make a diagnosis. There are many treatment options available for chronic pain. However, finding a treatment that works well for you may require trying various approaches until the right one is found. Many people benefit from a combination of two or more types of treatment to control their pain. SYMPTOMS  Chronic pain can occur anywhere in the body and can range from mild to very severe. Some types of chronic pain include:  Headache.  Low back pain.  Cancer pain.  Arthritis pain.  Neurogenic pain. This is pain resulting from damage to nerves. People with chronic pain may also have other symptoms such as:  Depression.  Anger.  Insomnia.  Anxiety. DIAGNOSIS  Your health care provider will help diagnose your condition over time. In many cases, the initial focus will be on excluding possible conditions that could be causing the pain. Depending on your symptoms, your health care provider may order tests to diagnose your condition. Some of these tests may include:   Blood tests.   CT scan.   MRI.   X-rays.   Ultrasounds.   Nerve conduction studies.  You may need to see a specialist.  TREATMENT  Finding treatment that works well may take time. You may be referred to a pain specialist. He or she may prescribe medicine or therapies, such as:   Mindful meditation or yoga.  Shots (injections) of numbing or pain-relieving medicines into the spine or area of  pain.  Local electrical stimulation.  Acupuncture.   Massage therapy.   Aroma, color, light, or sound therapy.   Biofeedback.   Working with a physical therapist to keep from getting stiff.   Regular, gentle exercise.   Cognitive or behavioral therapy.   Group support.  Sometimes, surgery may be recommended.  HOME CARE INSTRUCTIONS   Take all medicines as directed by your health care provider.   Lessen stress in your life by relaxing and doing things such as listening to calming music.   Exercise or be active as directed by your health care provider.   Eat a healthy diet and include things such as vegetables, fruits, fish, and lean meats in your diet.   Keep all follow-up appointments with your health care provider.   Attend a support group with others suffering from chronic pain. SEEK MEDICAL CARE IF:   Your pain gets worse.   You develop a new pain that was not there before.   You cannot tolerate medicines given to you by your health care provider.   You have new symptoms since your last visit with your health care provider.  SEEK IMMEDIATE MEDICAL CARE IF:  You feel weak.   You have decreased sensation or numbness.   You lose control of bowel or bladder function.   Your pain suddenly gets much worse.   You develop shaking.  You develop chills.  You develop confusion.  You develop chest pain.  You develop shortness of breath.  MAKE SURE YOU:  Understand these instructions.  Will watch your condition.  Will get help right away if you are not doing well or get worse.   This information is not intended to replace advice given to you by your health care provider. Make sure you discuss any questions you have with your health care provider.   Document Released: 03/08/2002 Document Revised: 02/16/2013 Document Reviewed: 12/10/2012 Elsevier Interactive Patient Education [employer].

## 2015-06-18 NOTE — ED Notes (Signed)
Here with chronic pain all over Swelling in left ankle with sharp, constant pain reported since Sunday Taking Oxycodone for pain

## 2015-06-19 NOTE — Progress Notes (Signed)
Internal Medicine Clinic Attending  I saw and evaluated the patient.  I personally confirmed the key portions of the history and exam documented by Dr. Juleen China and I reviewed pertinent patient test results.  The assessment, diagnosis, and plan were formulated together and I agree with the documentation in the resident's note. Agree with caution surrounding controlled substances.

## 2015-07-06 ENCOUNTER — Encounter (HOSPITAL_COMMUNITY): Payer: Self-pay

## 2015-07-06 ENCOUNTER — Emergency Department (INDEPENDENT_AMBULATORY_CARE_PROVIDER_SITE_OTHER)
Admission: EM | Admit: 2015-07-06 | Discharge: 2015-07-06 | Disposition: A | Discharge status: Home / Self Care | Payer: Self-pay | Source: Home / Self Care | Attending: Family Medicine | Admitting: Family Medicine

## 2015-07-06 DIAGNOSIS — K029 Dental caries, unspecified: Secondary | ICD-10-CM

## 2015-07-06 MED ORDER — AMOXICILLIN 500 MG PO CAPS: 500.0000 mg | ORAL_CAPSULE | Freq: Three times a day (TID) | ORAL | Status: DC

## 2015-07-06 MED ORDER — OXYCODONE HCL 15 MG PO TABS: 15.0000 mg | ORAL_TABLET | ORAL | Status: AC | PRN

## 2015-07-06 NOTE — ED Provider Notes (Signed)
CSN: [id number]     Arrival date & time 07/06/15  1305 History   First MD Initiated Contact with Patient 07/06/15 1342     Chief Complaint  Patient presents with  . Dental Pain   (Consider location/radiation/quality/duration/timing/severity/associated sxs/prior Treatment) HPI Right lower jaw swelling, for 3 days. States she has previously had 2 fillings in the tooth, but continues to have problems states that dentist takes no further responsibility of dental problems as pat states she is blaming the dentist for her current dental problems. No fever, symptoms present for several days , pain score 6. OTC meds not helping.   Past Medical History  Diagnosis Date  . Arthritis     neck, central lumbar area  . Neuropathy ([place])   . Hypertension   . Cervical spine disease 10/2012  . RP (retinitis pigmentosa)   . Fibroid tumor in breast  . Chronic pain syndrome   . Depression   . Vitamin D deficiency   . Myalgia and myositis, unspecified 04/07/2013  . Obesity   . Headache(784.0)   . Dyslipidemia   . Fibromyalgia   . Memory deficits 08/11/2013  . Osteoarthritis    Past Surgical History  Procedure Laterality Date  . Tubal ligation     Family History  Problem Relation Age of Onset  . Diabetes Maternal Grandmother   . Alzheimer's disease Maternal Grandmother    Social History  Substance Use Topics  . Smoking status: Former Smoker -- 0.33 packs/day for 12 years    Types: Cigarettes    Quit date: 06/30/2010  . Smokeless tobacco: Never Used     Comment: 1pack per 3 days.   . Alcohol Use: No   OB History    No data available     Review of Systems ROS +'ve toothache  Denies: HEADACHE, NAUSEA, ABDOMINAL PAIN, CHEST PAIN, CONGESTION, DYSURIA, SHORTNESS OF BREATH  Allergies  Review of patient's allergies indicates no known allergies.  Home Medications   Prior to Admission medications   Medication Sig Start Date End Date Taking? Authorizing Provider  ABILIFY 10 MG tablet Take 10  mg by mouth daily. 01/15/15   Historical Provider, MD  acetaminophen-codeine (TYLENOL #3) 300-30 MG per tablet Take 1 tablet by mouth every 6 (six) hours as needed. 02/07/15   Historical Provider, MD  ALPRAZolam Duanne Moron) 1 MG tablet 1 mg daily as needed.  02/17/15   Historical Provider, MD  amitriptyline (ELAVIL) 25 MG tablet Take 75 mg by mouth daily. 30 day supply given on 01/08/15 01/08/15   Historical Provider, MD  amoxicillin (AMOXIL) 500 MG capsule Take 1 capsule (500 mg total) by mouth 3 (three) times daily. 07/06/15   Konrad Felix, PA  busPIRone (BUSPAR) 15 MG tablet 15 mg 3 (three) times daily.  01/08/15   Historical Provider, MD  butalbital-acetaminophen-caffeine (FIORICET) 50-325-40 MG per tablet Take 1 tablet by mouth 4 (four) times daily as needed for headache.    Historical Provider, MD  carisoprodol (SOMA) 350 MG tablet Take 350 mg by mouth 3 (three) times daily as needed for muscle spasms.    Historical Provider, MD  Cholecalciferol (RA VITAMIN D-3) 1000 UNITS tablet Take 1,000 Units by mouth daily.    Historical Provider, MD  cycloSPORINE (RESTASIS) 0.05 % ophthalmic emulsion Place 1 drop into both eyes 2 (two) times daily as needed (for eye irritation).     Historical Provider, MD  FLUoxetine HCl 60 MG TABS Take 1 tablet by mouth at bedtime. Take one tablet by  mouth once daily at bedtime for axiety 06/15/15   Jule Ser, DO  lidocaine (LIDODERM) 5 % Place 1 patch onto the skin daily. Remove & Discard patch within 12 hours or as directed by MD Patient not taking: Reported on 03/02/2015 05/07/14   Delos Haring, PA-C  lisinopril-hydrochlorothiazide (ZESTORETIC) 20-12.5 MG tablet Take 1 tablet by mouth daily. 06/15/15 06/14/16  Jule Ser, DO  naproxen (NAPROSYN) 500 MG tablet Take 1 tablet (500 mg total) by mouth 2 (two) times daily. 06/15/15   Jule Ser, DO  oxyCODONE (ROXICODONE) 15 MG immediate release tablet Take 1 tablet (15 mg total) by mouth every 4 (four) hours as needed  for pain. 07/06/15   Konrad Felix, PA  ranitidine (ZANTAC) 300 MG tablet Take 1 tablet by mouth 2 (two) times daily. 01/08/15   Historical Provider, MD  Vitamin D, Ergocalciferol, (DRISDOL) 50000 UNITS CAPS capsule  01/08/15   Historical Provider, MD   Meds Ordered and Administered this Visit  Medications - No data to display  BP 148/93 mmHg  Pulse 74  Temp(Src) 98.1 F (36.7 C) (Oral)  Resp 18  SpO2 100%  LMP 06/05/2015 No data found.   Physical Exam  Constitutional: She appears well-developed and well-nourished. No distress.  HENT:  Head: Normocephalic and atraumatic.  Right Ear: External ear normal.  Left Ear: External ear normal.  Mouth/Throat: Mucous membranes are normal. Dental caries present. No posterior oropharyngeal erythema or tonsillar abscesses.      ED Course  Procedures (including critical care time)  Labs Review Labs Reviewed - No data to display  Imaging Review No results found.   Visual Acuity Review  Right Eye Distance:   Left Eye Distance:   Bilateral Distance:    Right Eye Near:   Left Eye Near:    Bilateral Near:         MDM   1. Pain due to dental caries    Patient is advised to continue home symptomatic treatment. Prescription for amoxil, oxycodone sent pharmacy patient has indicated. Patient is advised that if there are new or worsening symptoms or attend the emergency department, or contact primary care provider. Instructions of care provided discharged home in stable condition.  THIS NOTE WAS GENERATED USING A VOICE RECOGNITION SOFTWARE PROGRAM. ALL REASONABLE EFFORTS  WERE MADE TO PROOFREAD THIS DOCUMENT FOR ACCURACY.     Konrad Felix, PA 07/06/15 1630

## 2015-07-06 NOTE — ED Notes (Signed)
Pt stated that she is having tooth pain for 6 days that radiates to her neck. Pt alert and oriented

## 2015-07-06 NOTE — Discharge Instructions (Signed)

## 2015-07-15 IMAGING — CR DG LUMBAR SPINE COMPLETE 4+V
5 series · 5 of 5 positions shown · non-contrast
Comparison: 05/07/2014

CLINICAL DATA: Fall yesterday onto tailbone in bathtub. Pain
radiates down to both feet. Initial encounter.

EXAM:
LUMBAR SPINE - COMPLETE 4+ VIEW

[t lumbar spine ap]
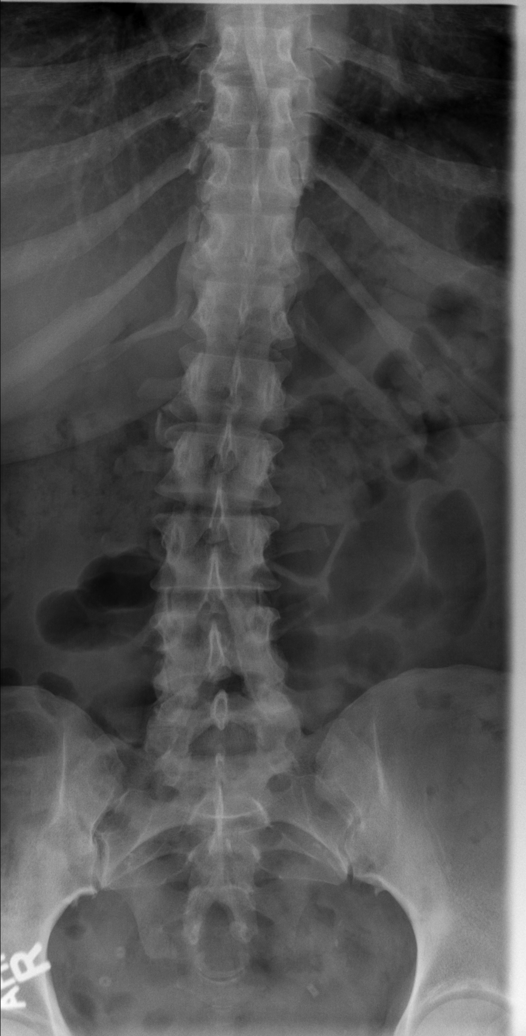

[t lumbar spine obl (1 of 2)]
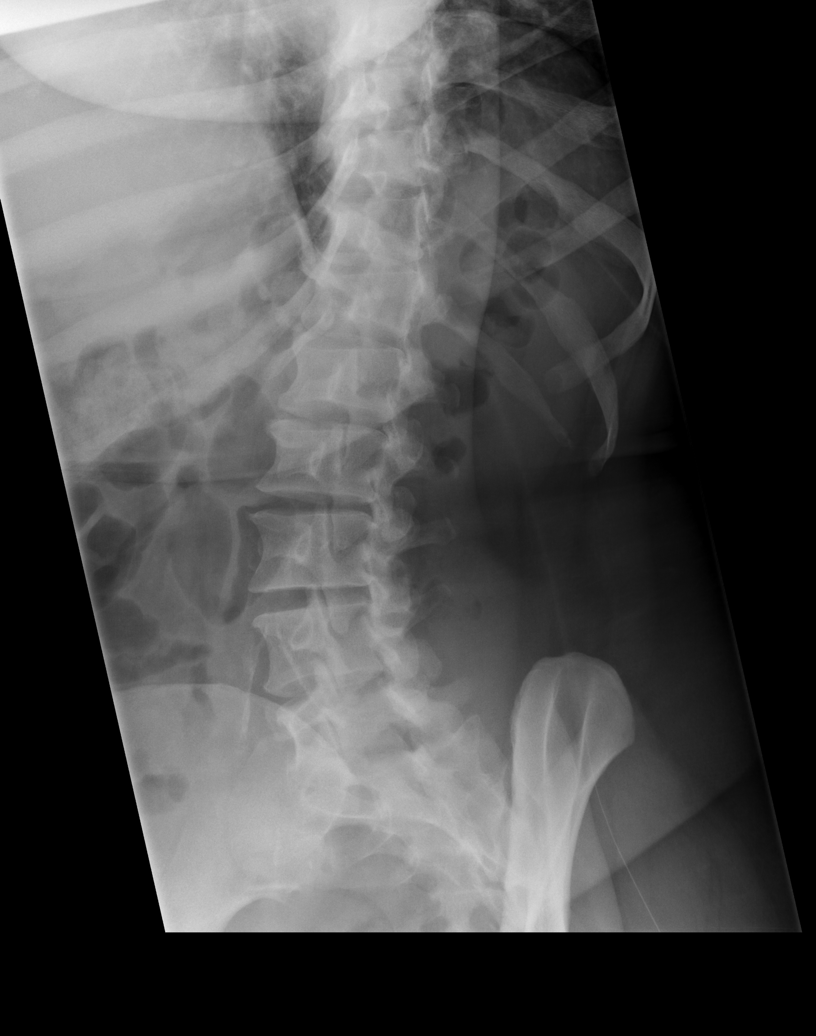

[t lumbar spine obl (2 of 2)]
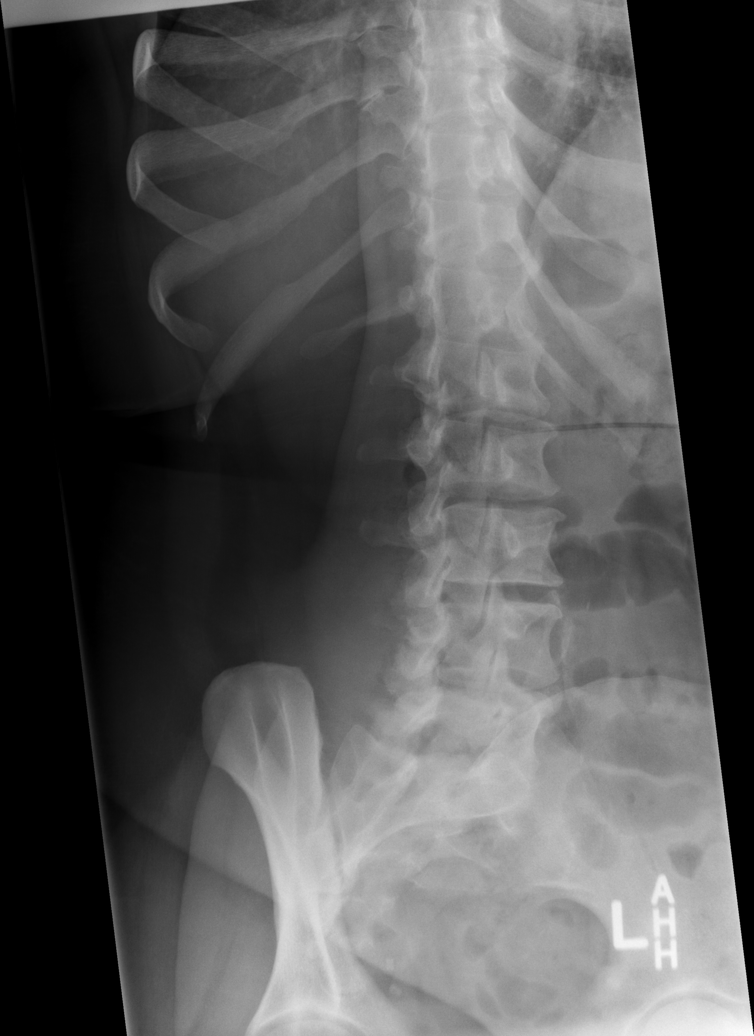

[t lumbar spine lat]
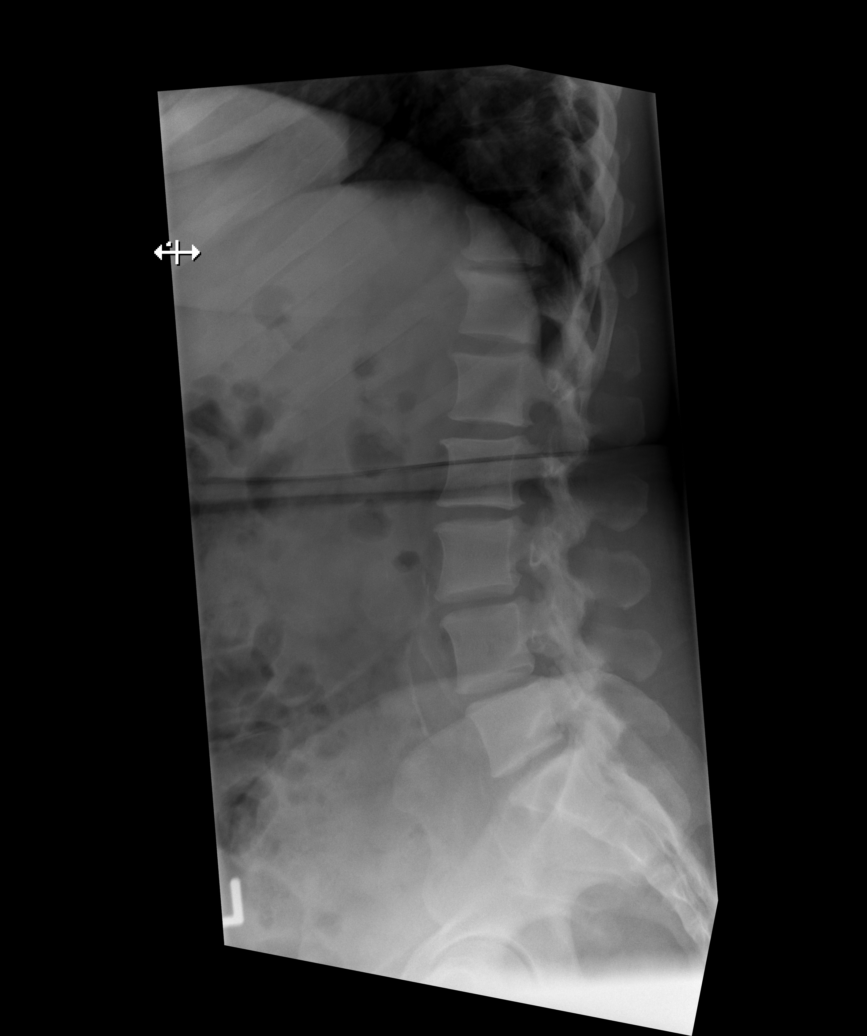

[t lumbar l-5 s-1 spot]
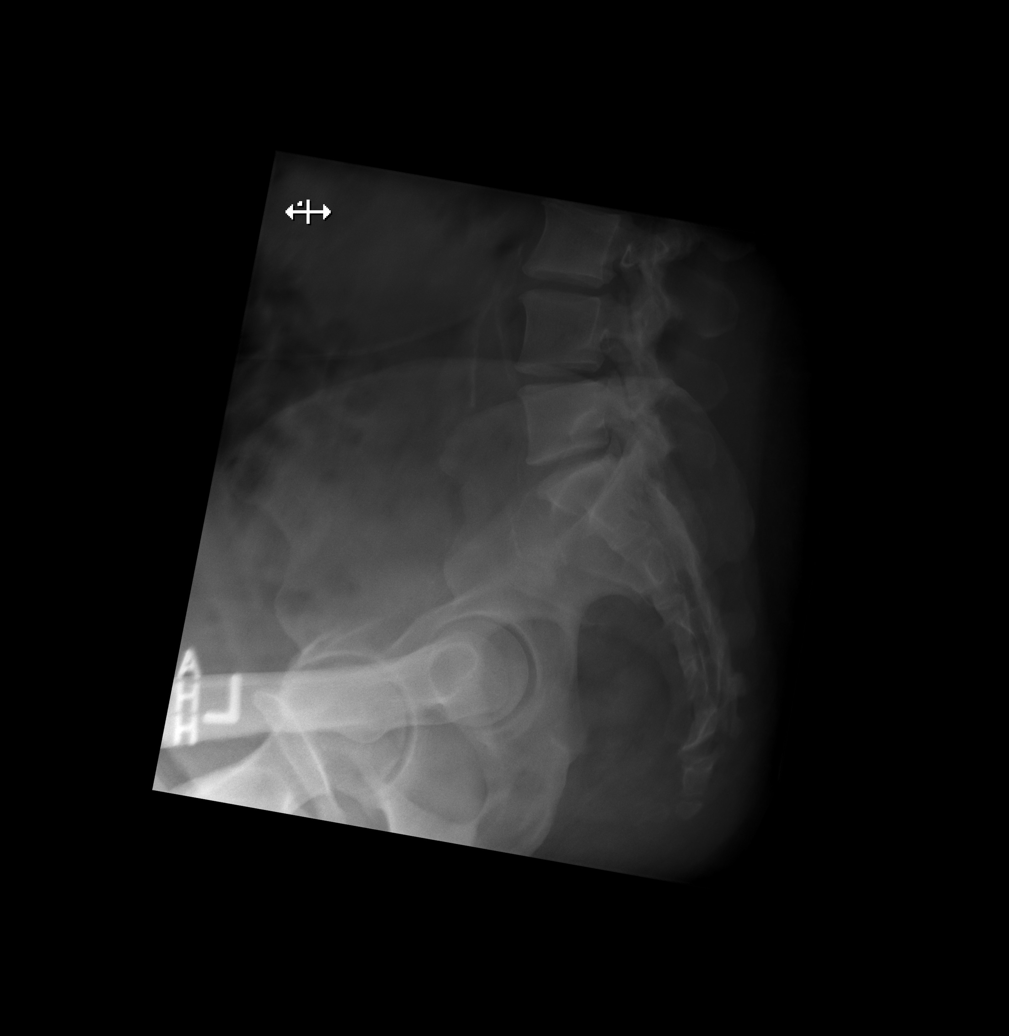

[5 of 5 positions shown; findings below may reference images not displayed]

FINDINGS: There are 5 non rib-bearing lumbar type vertebral bodies. Slight
left convex thoracolumbar curvature is similar to the prior study.
There is no listhesis. Vertebral body heights are preserved without
evidence of compression fracture. Moderate anterior osteophytosis is
again seen at T11-12. Mild multilevel endplate osteophytosis is
present in the lumbar spine with mild multilevel disc space
narrowing, most notably at L4-5 and L5-S1 and and not significantly
changed. No pars defects are identified. Sacrum and coccyx are more
fully evaluated on separate dedicated radiographs.
IMPRESSION: No acute osseous abnormality identified.  Mild spondylosis.

## 2015-07-16 ENCOUNTER — Encounter (HOSPITAL_COMMUNITY): Payer: Self-pay | Admitting: Emergency Medicine

## 2015-07-16 ENCOUNTER — Emergency Department (INDEPENDENT_AMBULATORY_CARE_PROVIDER_SITE_OTHER)
Admission: EM | Admit: 2015-07-16 | Discharge: 2015-07-16 | Disposition: A | Discharge status: Home / Self Care | Payer: Self-pay | Source: Home / Self Care | Attending: Family Medicine | Admitting: Family Medicine

## 2015-07-16 ENCOUNTER — Emergency Department (HOSPITAL_COMMUNITY)
Admission: EM | Admit: 2015-07-16 | Discharge: 2015-07-16 | Disposition: A | Discharge status: Home / Self Care | Payer: Self-pay

## 2015-07-16 DIAGNOSIS — Z760 Encounter for issue of repeat prescription: Secondary | ICD-10-CM

## 2015-07-16 DIAGNOSIS — G894 Chronic pain syndrome: Secondary | ICD-10-CM

## 2015-07-16 NOTE — ED Notes (Signed)
Patient has chronic pain issues, patient of pain clinic.  Patient cannot be seen at pain clinic because medicaid lapsed and does not have cash to be seen.  Speculates will be able to be seen by pain clinic in February.  Patient says no new injury .  Pain escalated for 2 days.  Patient reports sharp pain from neck down back.  Patient is out of oxycodone, last script by this facility was 07/06/15.

## 2015-07-16 NOTE — ED Notes (Signed)
Patient not in treatment room, patient thought to be in bathroom

## 2015-07-16 NOTE — ED Notes (Signed)
Patient left AMA.

## 2015-07-16 NOTE — ED Provider Notes (Signed)
CSN: [id number]     Arrival date & time 07/16/15  1342 History   First MD Initiated Contact with Patient 07/16/15 1533     Chief Complaint  Patient presents with  . Back Pain  . Neck Pain   (Consider location/radiation/quality/duration/timing/severity/associated sxs/prior Treatment) HPI Comments: 45 year old female with chronic musculoskeletal pain, primarily muscle pain in the neck, down the back, across her shoulders, lower back radiating into the extremities. She has been receiving oxycodone 15 mg tablets approximately 150 per month from pain clinic and other sources according to the [place] controlled substance reporting system. She states that there is been a problem with her Medicaid and she cannot go back to the pain clinic or follow-up with her PCP because the Medicaid is primarily not and affect. She states that she was here for toothache on January 6 and was given oxycodone 15 mg tablets #30 and was under the impression that we would be able to continue to refill them. There are no new symptoms. She does mention that she fell down 2 or 3 weeks ago but she had her ankle checked at that time an x-ray revealed no bony injuries. She continues to ambulate and bear weight and this is increasing the pain in her left ankle.   Past Medical History  Diagnosis Date  . Arthritis     neck, central lumbar area  . Neuropathy ([place])   . Hypertension   . Cervical spine disease 10/2012  . RP (retinitis pigmentosa)   . Fibroid tumor in breast  . Chronic pain syndrome   . Depression   . Vitamin D deficiency   . Myalgia and myositis, unspecified 04/07/2013  . Obesity   . Headache(784.0)   . Dyslipidemia   . Fibromyalgia   . Memory deficits 08/11/2013  . Osteoarthritis    Past Surgical History  Procedure Laterality Date  . Tubal ligation     Family History  Problem Relation Age of Onset  . Diabetes Maternal Grandmother   . Alzheimer's disease Maternal Grandmother    Social History   Substance Use Topics  . Smoking status: Former Smoker -- 0.33 packs/day for 12 years    Types: Cigarettes    Quit date: 06/30/2010  . Smokeless tobacco: Never Used     Comment: 1pack per 3 days.   . Alcohol Use: No   OB History    No data available     Review of Systems  Constitutional: Positive for activity change. Negative for fever and fatigue.  Respiratory: Negative.   Cardiovascular: Negative.   Musculoskeletal: Positive for myalgias, back pain, arthralgias, gait problem, neck pain and neck stiffness.  Skin: Negative.     Allergies  Review of patient's allergies indicates no known allergies.  Home Medications   Prior to Admission medications   Medication Sig Start Date End Date Taking? Authorizing Provider  ABILIFY 10 MG tablet Take 10 mg by mouth daily. 01/15/15   Historical Provider, MD  acetaminophen-codeine (TYLENOL #3) 300-30 MG per tablet Take 1 tablet by mouth every 6 (six) hours as needed. 02/07/15   Historical Provider, MD  ALPRAZolam Duanne Moron) 1 MG tablet 1 mg daily as needed.  02/17/15   Historical Provider, MD  amitriptyline (ELAVIL) 25 MG tablet Take 75 mg by mouth daily. 30 day supply given on 01/08/15 01/08/15   Historical Provider, MD  amoxicillin (AMOXIL) 500 MG capsule Take 1 capsule (500 mg total) by mouth 3 (three) times daily. 07/06/15   Konrad Felix, PA  busPIRone (BUSPAR) 15 MG tablet 15 mg 3 (three) times daily.  01/08/15   Historical Provider, MD  butalbital-acetaminophen-caffeine (FIORICET) 50-325-40 MG per tablet Take 1 tablet by mouth 4 (four) times daily as needed for headache.    Historical Provider, MD  carisoprodol (SOMA) 350 MG tablet Take 350 mg by mouth 3 (three) times daily as needed for muscle spasms.    Historical Provider, MD  Cholecalciferol (RA VITAMIN D-3) 1000 UNITS tablet Take 1,000 Units by mouth daily.    Historical Provider, MD  cycloSPORINE (RESTASIS) 0.05 % ophthalmic emulsion Place 1 drop into both eyes 2 (two) times daily as  needed (for eye irritation).     Historical Provider, MD  FLUoxetine HCl 60 MG TABS Take 1 tablet by mouth at bedtime. Take one tablet by mouth once daily at bedtime for axiety 06/15/15   Jule Ser, DO  lidocaine (LIDODERM) 5 % Place 1 patch onto the skin daily. Remove & Discard patch within 12 hours or as directed by MD Patient not taking: Reported on 03/02/2015 05/07/14   Delos Haring, PA-C  lisinopril-hydrochlorothiazide (ZESTORETIC) 20-12.5 MG tablet Take 1 tablet by mouth daily. 06/15/15 06/14/16  Jule Ser, DO  naproxen (NAPROSYN) 500 MG tablet Take 1 tablet (500 mg total) by mouth 2 (two) times daily. 06/15/15   Jule Ser, DO  oxyCODONE (ROXICODONE) 15 MG immediate release tablet Take 1 tablet (15 mg total) by mouth every 4 (four) hours as needed for pain. 07/06/15   Konrad Felix, PA  ranitidine (ZANTAC) 300 MG tablet Take 1 tablet by mouth 2 (two) times daily. 01/08/15   Historical Provider, MD  Vitamin D, Ergocalciferol, (DRISDOL) 50000 UNITS CAPS capsule  01/08/15   Historical Provider, MD   Meds Ordered and Administered this Visit  Medications - No data to display  BP 158/84 mmHg  Pulse 79  Temp(Src) 98 F (36.7 C) (Oral)  SpO2 100%  LMP 07/06/2015 No data found.   Physical Exam  Constitutional: She is oriented to person, place, and time. She appears well-developed and well-nourished. No distress.  Neck: Normal range of motion. Neck supple.  Cardiovascular: Normal rate.   Pulmonary/Chest: Effort normal. No respiratory distress.  Musculoskeletal:  Light palpation to the entire neck, upper back, mid back and lower back musculature produces pain responses, moans and groans from the patient. She is also complaining of pain to the lateral aspect of the left ankle where there is minimal swelling since the fall over 2 weeks ago. She has continued to bear weight on the left ankle and has not completely healed. She is ambulatory with assistance of a walker or cane.   Neurological: She is alert and oriented to person, place, and time. She exhibits normal muscle tone.  Skin: Skin is warm and dry.  Psychiatric: She has a normal mood and affect.  Nursing note and vitals reviewed.   ED Course  Procedures (including critical care time)  Labs Review Labs Reviewed - No data to display  Imaging Review No results found.   Visual Acuity Review  Right Eye Distance:   Left Eye Distance:   Bilateral Distance:    Right Eye Near:   Left Eye Near:    Bilateral Near:         MDM   1. Chronic pain disorder   2. Encounter for medication refill    Patient states she is primarily here for refill of her oxycodone because she is unable to get it at any other source in  which she has to pay money. I explained that the urgent care here does not treat chronic pain we would not be able to help her in that regard. I did offer other medications but states that none of those work and she has plenty other nonnarcotic medications that do not work. She is to follow-up with her primary care doctor is in is possible. She is also recommended to follow-up with a pain clinic as soon as possible.    Janne Napoleon, NP 07/16/15 [phone]

## 2015-07-16 NOTE — Discharge Instructions (Signed)
Chronic Pain [facility] will not be able to prescribe your chronic narcotic pain medication. Chronic pain can be defined as pain that is off and on and lasts for 3-6 months or longer. Many things cause chronic pain, which can make it difficult to make a diagnosis. There are many treatment options available for chronic pain. However, finding a treatment that works well for you may require trying various approaches until the right one is found. Many people benefit from a combination of two or more types of treatment to control their pain. SYMPTOMS  Chronic pain can occur anywhere in the body and can range from mild to very severe. Some types of chronic pain include:  Headache.  Low back pain.  Cancer pain.  Arthritis pain.  Neurogenic pain. This is pain resulting from damage to nerves. People with chronic pain may also have other symptoms such as:  Depression.  Anger.  Insomnia.  Anxiety. DIAGNOSIS  Your health care provider will help diagnose your condition over time. In many cases, the initial focus will be on excluding possible conditions that could be causing the pain. Depending on your symptoms, your health care provider may order tests to diagnose your condition. Some of these tests may include:   Blood tests.   CT scan.   MRI.   X-rays.   Ultrasounds.   Nerve conduction studies.  You may need to see a specialist.  TREATMENT  Finding treatment that works well may take time. You may be referred to a pain specialist. He or she may prescribe medicine or therapies, such as:   Mindful meditation or yoga.  Shots (injections) of numbing or pain-relieving medicines into the spine or area of pain.  Local electrical stimulation.  Acupuncture.   Massage therapy.   Aroma, color, light, or sound therapy.   Biofeedback.   Working with a physical therapist to keep from getting stiff.   Regular, gentle exercise.   Cognitive or behavioral therapy.    Group support.  Sometimes, surgery may be recommended.  HOME CARE INSTRUCTIONS   Take all medicines as directed by your health care provider.   Lessen stress in your life by relaxing and doing things such as listening to calming music.   Exercise or be active as directed by your health care provider.   Eat a healthy diet and include things such as vegetables, fruits, fish, and lean meats in your diet.   Keep all follow-up appointments with your health care provider.   Attend a support group with others suffering from chronic pain. SEEK MEDICAL CARE IF:   Your pain gets worse.   You develop a new pain that was not there before.   You cannot tolerate medicines given to you by your health care provider.   You have new symptoms since your last visit with your health care provider.  SEEK IMMEDIATE MEDICAL CARE IF:   You feel weak.   You have decreased sensation or numbness.   You lose control of bowel or bladder function.   Your pain suddenly gets much worse.   You develop shaking.  You develop chills.  You develop confusion.  You develop chest pain.  You develop shortness of breath.  MAKE SURE YOU:  Understand these instructions.  Will watch your condition.  Will get help right away if you are not doing well or get worse.   This information is not intended to replace advice given to you by your health care provider. Make sure you discuss  any questions you have with your health care provider.   Document Released: 03/08/2002 Document Revised: 02/16/2013 Document Reviewed: 12/10/2012 Elsevier Interactive Patient Education [employer].

## 2015-07-17 ENCOUNTER — Emergency Department (HOSPITAL_COMMUNITY)
Admission: EM | Admit: 2015-07-17 | Discharge: 2015-07-17 | Disposition: A | Discharge status: Home / Self Care | Payer: Self-pay | Attending: Emergency Medicine | Admitting: Emergency Medicine

## 2015-07-17 ENCOUNTER — Encounter (HOSPITAL_COMMUNITY): Payer: Self-pay | Admitting: Emergency Medicine

## 2015-07-17 ENCOUNTER — Emergency Department (HOSPITAL_COMMUNITY): Payer: Self-pay

## 2015-07-17 DIAGNOSIS — Y9289 Other specified places as the place of occurrence of the external cause: Secondary | ICD-10-CM | POA: Insufficient documentation

## 2015-07-17 DIAGNOSIS — W19XXXA Unspecified fall, initial encounter: Secondary | ICD-10-CM

## 2015-07-17 DIAGNOSIS — E669 Obesity, unspecified: Secondary | ICD-10-CM | POA: Insufficient documentation

## 2015-07-17 DIAGNOSIS — M797 Fibromyalgia: Secondary | ICD-10-CM | POA: Insufficient documentation

## 2015-07-17 DIAGNOSIS — M542 Cervicalgia: Secondary | ICD-10-CM

## 2015-07-17 DIAGNOSIS — W108XXA Fall (on) (from) other stairs and steps, initial encounter: Secondary | ICD-10-CM | POA: Insufficient documentation

## 2015-07-17 DIAGNOSIS — G894 Chronic pain syndrome: Secondary | ICD-10-CM | POA: Insufficient documentation

## 2015-07-17 DIAGNOSIS — Z87891 Personal history of nicotine dependence: Secondary | ICD-10-CM | POA: Insufficient documentation

## 2015-07-17 DIAGNOSIS — M79672 Pain in left foot: Secondary | ICD-10-CM

## 2015-07-17 DIAGNOSIS — Z8542 Personal history of malignant neoplasm of other parts of uterus: Secondary | ICD-10-CM | POA: Insufficient documentation

## 2015-07-17 DIAGNOSIS — S199XXA Unspecified injury of neck, initial encounter: Secondary | ICD-10-CM | POA: Insufficient documentation

## 2015-07-17 DIAGNOSIS — S99922A Unspecified injury of left foot, initial encounter: Secondary | ICD-10-CM | POA: Insufficient documentation

## 2015-07-17 DIAGNOSIS — Z79899 Other long term (current) drug therapy: Secondary | ICD-10-CM | POA: Insufficient documentation

## 2015-07-17 DIAGNOSIS — F329 Major depressive disorder, single episode, unspecified: Secondary | ICD-10-CM | POA: Insufficient documentation

## 2015-07-17 DIAGNOSIS — M545 Low back pain: Secondary | ICD-10-CM

## 2015-07-17 DIAGNOSIS — M1990 Unspecified osteoarthritis, unspecified site: Secondary | ICD-10-CM | POA: Insufficient documentation

## 2015-07-17 DIAGNOSIS — I10 Essential (primary) hypertension: Secondary | ICD-10-CM | POA: Insufficient documentation

## 2015-07-17 DIAGNOSIS — Y9389 Activity, other specified: Secondary | ICD-10-CM | POA: Insufficient documentation

## 2015-07-17 DIAGNOSIS — Y998 Other external cause status: Secondary | ICD-10-CM | POA: Insufficient documentation

## 2015-07-17 DIAGNOSIS — M5440 Lumbago with sciatica, unspecified side: Secondary | ICD-10-CM | POA: Insufficient documentation

## 2015-07-17 MED ORDER — NAPROXEN 250 MG PO TABS: 250.0000 mg | ORAL_TABLET | Freq: Two times a day (BID) | ORAL | Status: DC

## 2015-07-17 MED ORDER — METHOCARBAMOL 500 MG PO TABS: 500.0000 mg | ORAL_TABLET | Freq: Two times a day (BID) | ORAL | Status: DC | PRN

## 2015-07-17 NOTE — Discharge Instructions (Signed)
Back Exercises The following exercises strengthen the muscles that help to support the back. They also help to keep the lower back flexible. Doing these exercises can help to prevent back pain or lessen existing pain. If you have back pain or discomfort, try doing these exercises 2-3 times each day or as told by your health care provider. When the pain goes away, do them once each day, but increase the number of times that you repeat the steps for each exercise (do more repetitions). If you do not have back pain or discomfort, do these exercises once each day or as told by your health care provider. EXERCISES Single Knee to Chest Repeat these steps 3-5 times for each leg:  Lie on your back on a firm bed or the floor with your legs extended.  Bring one knee to your chest. Your other leg should stay extended and in contact with the floor.  Hold your knee in place by grabbing your knee or thigh.  Pull on your knee until you feel a gentle stretch in your lower back.  Hold the stretch for 10-30 seconds.  Slowly release and straighten your leg. Pelvic Tilt Repeat these steps 5-10 times:  Lie on your back on a firm bed or the floor with your legs extended.  Bend your knees so they are pointing toward the ceiling and your feet are flat on the floor.  Tighten your lower abdominal muscles to press your lower back against the floor. This motion will tilt your pelvis so your tailbone points up toward the ceiling instead of pointing to your feet or the floor.  With gentle tension and even breathing, hold this position for 5-10 seconds. Cat-Cow Repeat these steps until your lower back becomes more flexible:  Get into a hands-and-knees position on a firm surface. Keep your hands under your shoulders, and keep your knees under your hips. You may place padding under your knees for comfort.  Let your head hang down, and point your tailbone toward the floor so your lower back becomes rounded like the  back of a cat.  Hold this position for 5 seconds.  Slowly lift your head and point your tailbone up toward the ceiling so your back forms a sagging arch like the back of a cow.  Hold this position for 5 seconds. Press-Ups Repeat these steps 5-10 times:  Lie on your abdomen (face-down) on the floor.  Place your palms near your head, about shoulder-width apart.  While you keep your back as relaxed as possible and keep your hips on the floor, slowly straighten your arms to raise the top half of your body and lift your shoulders. Do not use your back muscles to raise your upper torso. You may adjust the placement of your hands to make yourself more comfortable.  Hold this position for 5 seconds while you keep your back relaxed.  Slowly return to lying flat on the floor. Bridges Repeat these steps 10 times:  Lie on your back on a firm surface.  Bend your knees so they are pointing toward the ceiling and your feet are flat on the floor.  Tighten your buttocks muscles and lift your buttocks off of the floor until your waist is at almost the same height as your knees. You should feel the muscles working in your buttocks and the back of your thighs. If you do not feel these muscles, slide your feet 1-2 inches farther away from your buttocks.  Hold this position for 3-5  seconds.  Slowly lower your hips to the starting position, and allow your buttocks muscles to relax completely. If this exercise is too easy, try doing it with your arms crossed over your chest. Abdominal Crunches Repeat these steps 5-10 times:  Lie on your back on a firm bed or the floor with your legs extended.  Bend your knees so they are pointing toward the ceiling and your feet are flat on the floor.  Cross your arms over your chest.  Tip your chin slightly toward your chest without bending your neck.  Tighten your abdominal muscles and slowly raise your trunk (torso) high enough to lift your shoulder blades a  tiny bit off of the floor. Avoid raising your torso higher than that, because it can put too much stress on your low back and it does not help to strengthen your abdominal muscles.  Slowly return to your starting position. Back Lifts Repeat these steps 5-10 times: 1. Lie on your abdomen (face-down) with your arms at your sides, and rest your forehead on the floor. 2. Tighten the muscles in your legs and your buttocks. 3. Slowly lift your chest off of the floor while you keep your hips pressed to the floor. Keep the back of your head in line with the curve in your back. Your eyes should be looking at the floor. 4. Hold this position for 3-5 seconds. 5. Slowly return to your starting position. SEEK MEDICAL CARE IF:  Your back pain or discomfort gets much worse when you do an exercise.  Your back pain or discomfort does not lessen within 2 hours after you exercise. If you have any of these problems, stop doing these exercises right away. Do not do them again unless your health care provider says that you can. SEEK IMMEDIATE MEDICAL CARE IF:  You develop sudden, severe back pain. If this happens, stop doing the exercises right away. Do not do them again unless your health care provider says that you can.   This information is not intended to replace advice given to you by your health care provider. Make sure you discuss any questions you have with your health care provider.   Document Released: 07/24/2004 Document Revised: 03/07/2015 Document Reviewed: 08/10/2014 Elsevier Interactive Patient Education 2016 [employer]  Back Pain, Adult Back pain is very common in adults.The cause of back pain is rarely dangerous and the pain often gets better over time.The cause of your back pain may not be known. Some common causes of back pain include:  Strain of the muscles or ligaments supporting the spine.  Wear and tear (degeneration) of the spinal disks.  Arthritis.  Direct injury to the  back. For many people, back pain may return. Since back pain is rarely dangerous, most people can learn to manage this condition on their own. HOME CARE INSTRUCTIONS Watch your back pain for any changes. The following actions may help to lessen any discomfort you are feeling:  Remain active. It is stressful on your back to sit or stand in one place for long periods of time. Do not sit, drive, or stand in one place for more than 30 minutes at a time. Take short walks on even surfaces as soon as you are able.Try to increase the length of time you walk each day.  Exercise regularly as directed by your health care provider. Exercise helps your back heal faster. It also helps avoid future injury by keeping your muscles strong and flexible.  Do not stay in  bed.Resting more than 1-2 days can delay your recovery.  Pay attention to your body when you bend and lift. The most comfortable positions are those that put less stress on your recovering back. Always use proper lifting techniques, including:  Bending your knees.  Keeping the load close to your body.  Avoiding twisting.  Find a comfortable position to sleep. Use a firm mattress and lie on your side with your knees slightly bent. If you lie on your back, put a pillow under your knees.  Avoid feeling anxious or stressed.Stress increases muscle tension and can worsen back pain.It is important to recognize when you are anxious or stressed and learn ways to manage it, such as with exercise.  Take medicines only as directed by your health care provider. Over-the-counter medicines to reduce pain and inflammation are often the most helpful.Your health care provider may prescribe muscle relaxant drugs.These medicines help dull your pain so you can more quickly return to your normal activities and healthy exercise.  Apply ice to the injured area:  Put ice in a plastic bag.  Place a towel between your skin and the bag.  Leave the ice on for 20  minutes, 2-3 times a day for the first 2-3 days. After that, ice and heat may be alternated to reduce pain and spasms.  Maintain a healthy weight. Excess weight puts extra stress on your back and makes it difficult to maintain good posture. SEEK MEDICAL CARE IF:  You have pain that is not relieved with rest or medicine.  You have increasing pain going down into the legs or buttocks.  You have pain that does not improve in one week.  You have night pain.  You lose weight.  You have a fever or chills. SEEK IMMEDIATE MEDICAL CARE IF:   You develop new bowel or bladder control problems.  You have unusual weakness or numbness in your arms or legs.  You develop nausea or vomiting.  You develop abdominal pain.  You feel faint.   This information is not intended to replace advice given to you by your health care provider. Make sure you discuss any questions you have with your health care provider.   Document Released: 06/16/2005 Document Revised: 07/07/2014 Document Reviewed: 10/18/2013 Elsevier Interactive Patient Education 2016 [place] in the [place]  Falls can cause injuries and can affect people from all age groups. There are many simple things that you can do to make your home safe and to help prevent falls. WHAT CAN I DO ON THE OUTSIDE OF MY HOME?  Regularly repair the edges of walkways and driveways and fix any cracks.  Remove high doorway thresholds.  Trim any shrubbery on the main path into your home.  Use bright outdoor lighting.  Clear walkways of debris and clutter, including tools and rocks.  Regularly check that handrails are securely fastened and in good repair. Both sides of any steps should have handrails.  Install guardrails along the edges of any raised decks or porches.  Have leaves, snow, and ice cleared regularly.  Use sand or salt on walkways during winter months.  In the garage, clean up any spills right away, including grease  or oil spills. WHAT CAN I DO IN THE BATHROOM?  Use night lights.  Install grab bars by the toilet and in the tub and shower. Do not use towel bars as grab bars.  Use non-skid mats or decals on the floor of the tub or shower.  If you  need to sit down while you are in the shower, use a plastic, non-slip stool.[employer] [employer]  Keep the floor dry. Immediately clean up any water that spills on the floor.  Remove soap buildup in the tub or shower on a regular basis.  Attach bath mats securely with double-sided non-slip rug tape.  Remove throw rugs and other tripping hazards from the floor. WHAT CAN I DO IN THE BEDROOM?  Use night lights.  Make sure that a bedside light is easy to reach.  Do not use oversized bedding that drapes onto the floor.  Have a firm chair that has side arms to use for getting dressed.  Remove throw rugs and other tripping hazards from the floor. WHAT CAN I DO IN THE KITCHEN?   Clean up any spills right away.  Avoid walking on wet floors.  Place frequently used items in easy-to-reach places.  If you need to reach for something above you, use a sturdy step stool that has a grab bar.  Keep electrical cables out of the way.  Do not use floor polish or wax that makes floors slippery. If you have to use wax, make sure that it is non-skid floor wax.  Remove throw rugs and other tripping hazards from the floor. WHAT CAN I DO IN THE STAIRWAYS?  Do not leave any items on the stairs.  Make sure that there are handrails on both sides of the stairs. Fix handrails that are broken or loose. Make sure that handrails are as long as the stairways.  Check any carpeting to make sure that it is firmly attached to the stairs. Fix any carpet that is loose or worn.  Avoid having throw rugs at the top or bottom of stairways, or secure the rugs with carpet tape to prevent them from moving.  Make sure that you have a light switch at the top of the stairs and the bottom of the stairs.  If you do not have them, have them installed. WHAT ARE SOME OTHER FALL PREVENTION TIPS?  Wear closed-toe shoes that fit well and support your feet. Wear shoes that have rubber soles or low heels.  When you use a stepladder, make sure that it is completely opened and that the sides are firmly locked. Have someone hold the ladder while you are using it. Do not climb a closed stepladder.  Add color or contrast paint or tape to grab bars and handrails in your home. Place contrasting color strips on the first and last steps.  Use mobility aids as needed, such as canes, walkers, scooters, and crutches.  Turn on lights if it is dark. Replace any light bulbs that burn out.  Set up furniture so that there are clear paths. Keep the furniture in the same spot.  Fix any uneven floor surfaces.  Choose a carpet design that does not hide the edge of steps of a stairway.  Be aware of any and all pets.  Review your medicines with your healthcare provider. Some medicines can cause dizziness or changes in blood pressure, which increase your risk of falling. Talk with your health care provider about other ways that you can decrease your risk of falls. This may include working with a physical therapist or trainer to improve your strength, balance, and endurance.   This information is not intended to replace advice given to you by your health care provider. Make sure you discuss any questions you have with your health care provider.   Document Released: 06/06/2002 Document Revised:  10/31/2014 Document Reviewed: 07/21/2014 Elsevier Interactive Patient Education [employer].

## 2015-07-17 NOTE — ED Provider Notes (Signed)
CSN: [id number]     Arrival date & time 07/17/15  0935 History  By signing my name below, I, Donna Coleman, attest that this documentation has been prepared under the direction and in the presence of non-physician practitioner, Waynetta Pean, PA-C. Electronically Signed: Evelene Coleman, Scribe. 07/17/2015. 10:59 AM.    Chief Complaint  Patient presents with  . Fall  . neck and back pain      The history is provided by the patient. No language interpreter was used.     HPI Comments:  Donna Coleman is a 45 y.o. female with a history of chronic pain due to arthritis and cervical spine disease, who presents to the Emergency Department s/p fall yesterday complaining of mild-moderate swelling and pain to the left foot, with associated back pain and neck pain following the incident. Pt lost her balance and slipped down 2 steps. She denies head injury and LOC, acute numbness and tingling in her extremities, nausea, vomiting, bowel/bladder incontinence, hematuria, dysuria, and difficulty urinating. She notes she has not been to pain management in 3 months and has been out of her chronic pain medications. She has been taking tylenol and ibuprofen for pain without relief; none today. Pt has been able to bear weight on the foot.  She currently ambulates with a cane.     Past Medical History  Diagnosis Date  . Arthritis     neck, central lumbar area  . Neuropathy ([place])   . Hypertension   . Cervical spine disease 10/2012  . RP (retinitis pigmentosa)   . Fibroid tumor in breast  . Chronic pain syndrome   . Depression   . Vitamin D deficiency   . Myalgia and myositis, unspecified 04/07/2013  . Obesity   . Headache(784.0)   . Dyslipidemia   . Fibromyalgia   . Memory deficits 08/11/2013  . Osteoarthritis    Past Surgical History  Procedure Laterality Date  . Tubal ligation     Family History  Problem Relation Age of Onset  . Diabetes Maternal Grandmother   . Alzheimer's disease Maternal  Grandmother    Social History  Substance Use Topics  . Smoking status: Former Smoker -- 0.33 packs/day for 12 years    Types: Cigarettes    Quit date: 06/30/2010  . Smokeless tobacco: Never Used     Comment: 1pack per 3 days.   . Alcohol Use: No   OB History    No data available     Review of Systems  Constitutional: Negative for fever and chills.  Respiratory: Negative for shortness of breath.   Cardiovascular: Negative for chest pain.  Gastrointestinal: Negative for nausea, vomiting and abdominal pain.  Genitourinary: Negative for dysuria, hematuria and difficulty urinating.  Musculoskeletal: Positive for myalgias, back pain, arthralgias and neck pain. Negative for neck stiffness.  Skin: Negative for rash.  Neurological: Negative for dizziness, syncope, weakness, light-headedness, numbness and headaches.    Allergies  Review of patient's allergies indicates no known allergies.  Home Medications   Prior to Admission medications   Medication Sig Start Date End Date Taking? Authorizing Provider  ABILIFY 10 MG tablet Take 10 mg by mouth daily. 01/15/15   Historical Provider, MD  acetaminophen-codeine (TYLENOL #3) 300-30 MG per tablet Take 1 tablet by mouth every 6 (six) hours as needed. 02/07/15   Historical Provider, MD  ALPRAZolam Duanne Moron) 1 MG tablet 1 mg daily as needed.  02/17/15   Historical Provider, MD  amitriptyline (ELAVIL) 25 MG tablet Take 75  mg by mouth daily. 30 day supply given on 01/08/15 01/08/15   Historical Provider, MD  amoxicillin (AMOXIL) 500 MG capsule Take 1 capsule (500 mg total) by mouth 3 (three) times daily. 07/06/15   Konrad Felix, PA  busPIRone (BUSPAR) 15 MG tablet 15 mg 3 (three) times daily.  01/08/15   Historical Provider, MD  butalbital-acetaminophen-caffeine (FIORICET) 50-325-40 MG per tablet Take 1 tablet by mouth 4 (four) times daily as needed for headache.    Historical Provider, MD  carisoprodol (SOMA) 350 MG tablet Take 350 mg by mouth 3  (three) times daily as needed for muscle spasms.    Historical Provider, MD  Cholecalciferol (RA VITAMIN D-3) 1000 UNITS tablet Take 1,000 Units by mouth daily.    Historical Provider, MD  cycloSPORINE (RESTASIS) 0.05 % ophthalmic emulsion Place 1 drop into both eyes 2 (two) times daily as needed (for eye irritation).     Historical Provider, MD  FLUoxetine HCl 60 MG TABS Take 1 tablet by mouth at bedtime. Take one tablet by mouth once daily at bedtime for axiety 06/15/15   Jule Ser, DO  lidocaine (LIDODERM) 5 % Place 1 patch onto the skin daily. Remove & Discard patch within 12 hours or as directed by MD Patient not taking: Reported on 03/02/2015 05/07/14   Delos Haring, PA-C  lisinopril-hydrochlorothiazide (ZESTORETIC) 20-12.5 MG tablet Take 1 tablet by mouth daily. 06/15/15 06/14/16  Jule Ser, DO  methocarbamol (ROBAXIN) 500 MG tablet Take 1 tablet (500 mg total) by mouth 2 (two) times daily as needed for muscle spasms. 07/17/15   Waynetta Pean, PA-C  naproxen (NAPROSYN) 250 MG tablet Take 1 tablet (250 mg total) by mouth 2 (two) times daily with a meal. 07/17/15   Waynetta Pean, PA-C  oxyCODONE (ROXICODONE) 15 MG immediate release tablet Take 1 tablet (15 mg total) by mouth every 4 (four) hours as needed for pain. 07/06/15   Konrad Felix, PA  ranitidine (ZANTAC) 300 MG tablet Take 1 tablet by mouth 2 (two) times daily. 01/08/15   Historical Provider, MD  Vitamin D, Ergocalciferol, (DRISDOL) 50000 UNITS CAPS capsule  01/08/15   Historical Provider, MD   BP 141/82 mmHg  Pulse 85  Temp(Src) 98.2 F (36.8 C) (Oral)  Resp 18  SpO2 100%  LMP 07/06/2015 Physical Exam  Constitutional: She is oriented to person, place, and time. She appears well-developed and well-nourished. No distress.  Nontoxic appearing.  HENT:  Head: Normocephalic and atraumatic.  Right Ear: External ear normal.  Left Ear: External ear normal.  Mouth/Throat: Oropharynx is clear and moist.  No evidence of any  head trauma.  Eyes: Conjunctivae and EOM are normal. Pupils are equal, round, and reactive to light. Right eye exhibits no discharge. Left eye exhibits no discharge.  Neck: Normal range of motion. Neck supple. No JVD present. No tracheal deviation present.  Patient has bilateral neck tenderness to palpation. No midline neck tenderness to palpation. No crepitus, deformity or edema. Good range of motion of her neck without difficulty.  Cardiovascular: Normal rate, regular rhythm, normal heart sounds and intact distal pulses.   Bilateral radial, posterior tibialis and dorsalis pedis pulses are intact.    Pulmonary/Chest: Effort normal and breath sounds normal. No respiratory distress. She has no wheezes. She has no rales.  Abdominal: Soft. There is no tenderness. There is no guarding.  Musculoskeletal: Normal range of motion. She exhibits tenderness. She exhibits no edema.  Patient has tenderness across her lumbar spine to her bilateral low  back and mid low back. No back edema, deformity, ecchymosis or erythema. 5 out of 5 strength in her bilateral lower extremities.  Mild tenderness to the lateral aspect of left foot. No left foot edema, deformity, ecchymosis or erythema. Good range of motion of her toes without difficulty. Good strength with plantar and dorsiflexion bilaterally.  Lymphadenopathy:    She has no cervical adenopathy.  Neurological: She is alert and oriented to person, place, and time. No cranial nerve deficit. Coordination normal.  The patient is alert and oriented 3. Cranial nerves are intact. Sensation is intact in her bilateral upper and lower extremities which is able to ambulate with her cane.  Skin: Skin is warm and dry. No rash noted. She is not diaphoretic. No erythema. No pallor.  Psychiatric: She has a normal mood and affect. Her behavior is normal.  Nursing note and vitals reviewed.   ED Course  Procedures   DIAGNOSTIC STUDIES:  Oxygen Saturation is 100% on RA,  normal by my interpretation.    COORDINATION OF CARE:  10:30 AM Will order imaging studies.  Discussed treatment plan with pt at bedside and pt agreed to plan.  Labs Review Labs Reviewed - No data to display  Imaging Review Dg Cervical Spine Complete  07/17/2015  CLINICAL DATA:  Fall yesterday, cervical and lumbar pain, left foot pain EXAM: CERVICAL SPINE - COMPLETE 4+ VIEW COMPARISON:  05/07/2014 FINDINGS: Five views of cervical spine submitted. Again noted mild anterior spurring lower endplate of C3, C4, C5 and C6 vertebral body. Stable mild disc space flattening at C4-C5 and C5-C6 level. Minimal disc space flattening with anterior spurring at C6-C7 level. No prevertebral soft tissue swelling. Cervical airway is patent. C1-C2 relationship is unremarkable. No acute fracture or subluxation. IMPRESSION: No acute fracture or subluxation. Mild degenerative changes again noted as described above. Electronically Signed   By: Lahoma Crocker M.D.   On: 07/17/2015 11:49   Dg Lumbar Spine Complete  07/17/2015  CLINICAL DATA:  Fall yesterday with lumbar pain.  Initial encounter. EXAM: LUMBAR SPINE - COMPLETE 4+ VIEW COMPARISON:  03/02/2015 FINDINGS: No evidence of fracture or traumatic malalignment. Lower lumbar facet arthropathy with overgrowth and sclerosis greatest at L4-5 and L5-S1. Diffuse disc narrowing and endplate degeneration. No evidence of progression when compared to 2015 MRI. Abdominal aortic atherosclerosis, notable for age. IMPRESSION: 1. No acute finding. 2. Degenerative changes without visible progression since prior. 3. Aortic atherosclerosis, notable for age. Electronically Signed   By: Monte Fantasia M.D.   On: 07/17/2015 11:31   Dg Foot Complete Left  07/17/2015  CLINICAL DATA:  Fall with left foot pain.  Initial encounter. EXAM: LEFT FOOT - COMPLETE 3+ VIEW COMPARISON:  None. FINDINGS: There is no evidence of fracture or dislocation. No acute soft tissue finding. Degenerative spurring at  the anterior tibiotalar joint. IMPRESSION: No fracture or subluxation. Electronically Signed   By: Monte Fantasia M.D.   On: 07/17/2015 11:33   I have personally reviewed and evaluated these images as part of my medical decision-making.   EKG Interpretation None      Filed Vitals:   07/17/15 0955 07/17/15 1200  BP: 138/100 141/82  Pulse: 73 85  Temp: 98.8 F (37.1 C) 98.2 F (36.8 C)  TempSrc: Oral Oral  Resp: 18 18  SpO2: 100% 100%     MDM   Meds given in ED:  Medications - No data to display  New Prescriptions   METHOCARBAMOL (ROBAXIN) 500 MG TABLET  Take 1 tablet (500 mg total) by mouth 2 (two) times daily as needed for muscle spasms.   NAPROXEN (NAPROSYN) 250 MG TABLET    Take 1 tablet (250 mg total) by mouth 2 (two) times daily with a meal.    Final diagnoses:  Fall, initial encounter  Neck pain  Bilateral low back pain, with sciatica presence unspecified  Left foot pain   This is a 45 y.o. female with a history of chronic pain due to arthritis and cervical spine disease, who presents to the Emergency Department s/p fall yesterday complaining of mild-moderate swelling and pain to the left foot, with associated back pain and neck pain following the incident. Pt lost her balance and slipped down 2 steps. She denies head injury and LOC, acute numbness and tingling in her extremities, nausea, vomiting, bowel/bladder incontinence, hematuria, dysuria, and difficulty urinating. She notes she has not been to pain management in 3 months and has been out of her chronic pain medications. She reports her pain is worse now after her fall. It is noted that the patient was seen in the urgent care yesterday with neck and back pain requesting pain medications.  Patient was looked up on [facility]. She was last given 30 tabs of 15mg  oxycodone  on 07/06/15 at Urgent care and 21 tabs of Tylenol #3 on 07/11/14 from a dentist. On exam the patient is afebrile and nontoxic  appearing. She has tenderness across her bilateral neck. No midline neck tenderness. No focal neurological deficits. She has tenderness across her bilateral low back. No back or neck erythema, deformity or ecchymosis. Patient can ambulate with her cane. No loss of bowel or bladder control. No focal neurological deficits. Patient is requesting x-rays. X-rays of her cervical spine and lumbar spine showed no acute finding. Left foot x-ray is unremarkable. Will provide the patient with naproxen and Robaxin and encouraged her to follow-up closely with primary care and her pain management specialist. I encouraged her to do back exercises. I discussed strict return precautions. I advised the patient to follow-up with their primary care provider this week. I advised the patient to return to the emergency department with new or worsening symptoms or new concerns. The patient verbalized understanding and agreement with plan.    I personally performed the services described in this documentation, which was scribed in my presence. The recorded information has been reviewed and is accurate.       Waynetta Pean, PA-C 07/17/15 1205  Daleen Bo, MD 07/17/15 [phone]

## 2015-07-17 NOTE — ED Notes (Signed)
Patient transported to X-ray 

## 2015-07-17 NOTE — ED Notes (Signed)
Pt sts fell down stairs yesterday c/o lower back pain and neck pain; pt sts hx of same in past and now more severe

## 2015-08-14 ENCOUNTER — Emergency Department (INDEPENDENT_AMBULATORY_CARE_PROVIDER_SITE_OTHER)
Admission: EM | Admit: 2015-08-14 | Discharge: 2015-08-14 | Disposition: A | Discharge status: Home / Self Care | Payer: Self-pay | Source: Home / Self Care | Attending: Family Medicine | Admitting: Family Medicine

## 2015-08-14 ENCOUNTER — Encounter (HOSPITAL_COMMUNITY): Payer: Self-pay | Admitting: Emergency Medicine

## 2015-08-14 DIAGNOSIS — K0889 Other specified disorders of teeth and supporting structures: Secondary | ICD-10-CM

## 2015-08-14 MED ORDER — CLINDAMYCIN HCL 300 MG PO CAPS: 150.0000 mg | ORAL_CAPSULE | Freq: Three times a day (TID) | ORAL | Status: DC

## 2015-08-14 NOTE — ED Notes (Signed)
C/o dental pain onset Sunday... Does not have a dentist  Pain is 10/10 A&O x4... No acute distress.

## 2015-08-14 NOTE — Discharge Instructions (Signed)
Dental Pain Dental pain may be caused by many things, including:  Tooth decay (cavities or caries). Cavities cause the nerve of your tooth to be open to air and hot or cold temperatures. This can cause pain or discomfort.  Abscess or infection. A dental abscess is an area that is full of infected pus from a bacterial infection in the inner part of the tooth (pulp). It usually happens at the end of the tooth's root.  Injury.  An unknown reason (idiopathic). Your pain may be mild or severe. It may only happen when:  You are chewing.  You are exposed to hot or cold temperature.  You are eating or drinking sugary foods or beverages, such as:  Soda.  Candy. Your pain may also be there all of the time. HOME CARE Watch your dental pain for any changes. Do these things to lessen your discomfort:  Take medicines only as told by your dentist.  If your dentist tells you to take an antibiotic medicine, finish all of it even if you start to feel better.  Keep all follow-up visits as told by your dentist. This is important.  Do not apply heat to the outside of your face.  Rinse your mouth or gargle with salt water if told by your dentist. This helps with pain and swelling.  You can make salt water by adding  tsp of salt to 1 cup of warm water.  Apply ice to the painful area of your face:  Put ice in a plastic bag.  Place a towel between your skin and the bag.  Leave the ice on for 20 minutes, 2-3 times per day.  Avoid foods or drinks that cause you pain, such as:  Very hot or very cold foods or drinks.  Sweet or sugary foods or drinks. GET HELP IF:  Your pain is not helped with medicines.  Your symptoms are worse.  You have new symptoms. GET HELP RIGHT AWAY IF:  You cannot open your mouth.  You are having trouble breathing or swallowing.  You have a fever.  Your face, neck, or jaw is puffy (swollen).   This information is not intended to replace advice given to  you by your health care provider. Make sure you discuss any questions you have with your health care provider.   Document Released: 12/03/2007 Document Revised: 10/31/2014 Document Reviewed: 06/12/2014 Elsevier Interactive Patient Education [employer].

## 2015-08-14 NOTE — ED Notes (Signed)
Pt d/c by frank p

## 2015-08-15 NOTE — ED Provider Notes (Signed)
CSN: [id number]     Arrival date & time 08/14/15  1322 History   First MD Initiated Contact with Patient 08/14/15 1425     Chief Complaint  Patient presents with  . Dental Pain   (Consider location/radiation/quality/duration/timing/severity/associated sxs/prior Treatment) HPI Patient presents with dental pain due to dental caries and a broken tooth. She states that the tooth just recently cracked. She is in considerable amount of pain at this time. He states that she is not able to see her dentist at this time because he would like to have $250 to repair her dentures. Patient states that she is in need of pain medicine. Over-the-counter medicines have not relieved her symptoms she states that she is unable to take ibuprofen. Past Medical History  Diagnosis Date  . Arthritis     neck, central lumbar area  . Neuropathy ([place])   . Hypertension   . Cervical spine disease 10/2012  . RP (retinitis pigmentosa)   . Fibroid tumor in breast  . Chronic pain syndrome   . Depression   . Vitamin D deficiency   . Myalgia and myositis, unspecified 04/07/2013  . Obesity   . Headache(784.0)   . Dyslipidemia   . Fibromyalgia   . Memory deficits 08/11/2013  . Osteoarthritis    Past Surgical History  Procedure Laterality Date  . Tubal ligation     Family History  Problem Relation Age of Onset  . Diabetes Maternal Grandmother   . Alzheimer's disease Maternal Grandmother    Social History  Substance Use Topics  . Smoking status: Former Smoker -- 0.33 packs/day for 12 years    Types: Cigarettes    Quit date: 06/30/2010  . Smokeless tobacco: Never Used     Comment: 1pack per 3 days.   . Alcohol Use: No   OB History    No data available     Review of Systems dental pain denies fever  Allergies  Review of patient's allergies indicates no known allergies.  Home Medications   Prior to Admission medications   Medication Sig Start Date End Date Taking? Authorizing Provider  ABILIFY 10 MG  tablet Take 10 mg by mouth daily. 01/15/15   Historical Provider, MD  acetaminophen-codeine (TYLENOL #3) 300-30 MG per tablet Take 1 tablet by mouth every 6 (six) hours as needed. 02/07/15   Historical Provider, MD  ALPRAZolam Duanne Moron) 1 MG tablet 1 mg daily as needed.  02/17/15   Historical Provider, MD  amitriptyline (ELAVIL) 25 MG tablet Take 75 mg by mouth daily. 30 day supply given on 01/08/15 01/08/15   Historical Provider, MD  amoxicillin (AMOXIL) 500 MG capsule Take 1 capsule (500 mg total) by mouth 3 (three) times daily. 07/06/15   Konrad Felix, PA  busPIRone (BUSPAR) 15 MG tablet 15 mg 3 (three) times daily.  01/08/15   Historical Provider, MD  butalbital-acetaminophen-caffeine (FIORICET) 50-325-40 MG per tablet Take 1 tablet by mouth 4 (four) times daily as needed for headache.    Historical Provider, MD  carisoprodol (SOMA) 350 MG tablet Take 350 mg by mouth 3 (three) times daily as needed for muscle spasms.    Historical Provider, MD  Cholecalciferol (RA VITAMIN D-3) 1000 UNITS tablet Take 1,000 Units by mouth daily.    Historical Provider, MD  clindamycin (CLEOCIN) 300 MG capsule Take 1 capsule (300 mg total) by mouth 3 (three) times daily. 08/14/15   Konrad Felix, PA  cycloSPORINE (RESTASIS) 0.05 % ophthalmic emulsion Place 1 drop into both eyes  2 (two) times daily as needed (for eye irritation).     Historical Provider, MD  FLUoxetine HCl 60 MG TABS Take 1 tablet by mouth at bedtime. Take one tablet by mouth once daily at bedtime for axiety 06/15/15   Jule Ser, DO  lidocaine (LIDODERM) 5 % Place 1 patch onto the skin daily. Remove & Discard patch within 12 hours or as directed by MD Patient not taking: Reported on 03/02/2015 05/07/14   Delos Haring, PA-C  lisinopril-hydrochlorothiazide (ZESTORETIC) 20-12.5 MG tablet Take 1 tablet by mouth daily. 06/15/15 06/14/16  Jule Ser, DO  methocarbamol (ROBAXIN) 500 MG tablet Take 1 tablet (500 mg total) by mouth 2 (two) times daily as  needed for muscle spasms. 07/17/15   Waynetta Pean, PA-C  naproxen (NAPROSYN) 250 MG tablet Take 1 tablet (250 mg total) by mouth 2 (two) times daily with a meal. 07/17/15   Waynetta Pean, PA-C  oxyCODONE (ROXICODONE) 15 MG immediate release tablet Take 1 tablet (15 mg total) by mouth every 4 (four) hours as needed for pain. 07/06/15   Konrad Felix, PA  ranitidine (ZANTAC) 300 MG tablet Take 1 tablet by mouth 2 (two) times daily. 01/08/15   Historical Provider, MD  Vitamin D, Ergocalciferol, (DRISDOL) 50000 UNITS CAPS capsule  01/08/15   Historical Provider, MD   Meds Ordered and Administered this Visit  Medications - No data to display  BP 167/93 mmHg  Pulse 91  Temp(Src) 99.3 F (37.4 C) (Oral)  Resp 16  SpO2 100%  LMP 08/07/2015 No data found.   Physical Exam  Constitutional: She appears well-developed and well-nourished.  HENT:  Teeth are been poor repair. There are no signs of abscess in the mouth. No signs of cellulitis.  Nursing note and vitals reviewed.   ED Course  Procedures (including critical care time)  Labs Review Labs Reviewed - No data to display  Imaging Review No results found.   Visual Acuity Review  Right Eye Distance:   Left Eye Distance:   Bilateral Distance:    Right Eye Near:   Left Eye Near:    Bilateral Near:        Patient has asked for more significant medication i.e. narcotics for pain I have advised patient that the best treatment for chronic dental pain is naproxen or ibuprofen not narcotics. She then asked why I was not concerned about treating her pain appropriately and I have advised patient that pain has been treated appropriately and that she needs to follow-up with her dentist. Paperwork for her to call [facility] has been given to the patient. She is advised to give them a call to set up an appointment.  MDM   1. Pain, dental    Patient is advised to continue home symptomatic treatment. Prescription for anaprox sent  pharmacy patient has indicated. Patient is advised that if there are new or worsening symptoms or attend the emergency department, or contact primary care provider. Instructions of care provided discharged home in stable condition. Return to work/school note provided.  THIS NOTE WAS GENERATED USING A VOICE RECOGNITION SOFTWARE PROGRAM. ALL REASONABLE EFFORTS  WERE MADE TO PROOFREAD THIS DOCUMENT FOR ACCURACY.     Konrad Felix, [place] 08/15/15 2030

## 2015-09-06 ENCOUNTER — Telehealth (HOSPITAL_COMMUNITY): Payer: Self-pay | Admitting: Emergency Medicine

## 2015-11-08 ENCOUNTER — Ambulatory Visit: Payer: Self-pay | Attending: Internal Medicine

## 2015-11-12 ENCOUNTER — Ambulatory Visit: Payer: Self-pay

## 2015-11-16 ENCOUNTER — Encounter: Payer: Self-pay | Admitting: Family Medicine

## 2015-11-16 ENCOUNTER — Ambulatory Visit (INDEPENDENT_AMBULATORY_CARE_PROVIDER_SITE_OTHER): Payer: No Typology Code available for payment source | Admitting: Family Medicine

## 2015-11-16 VITALS — BP 158/88 | HR 80 | Temp 98.9°F | Resp 16 | Ht 62.0 in | Wt 198.0 lb

## 2015-11-16 DIAGNOSIS — I10 Essential (primary) hypertension: Secondary | ICD-10-CM

## 2015-11-16 DIAGNOSIS — B351 Tinea unguium: Secondary | ICD-10-CM

## 2015-11-16 DIAGNOSIS — N393 Stress incontinence (female) (male): Secondary | ICD-10-CM

## 2015-11-16 DIAGNOSIS — F411 Generalized anxiety disorder: Secondary | ICD-10-CM

## 2015-11-16 DIAGNOSIS — G894 Chronic pain syndrome: Secondary | ICD-10-CM

## 2015-11-16 DIAGNOSIS — M797 Fibromyalgia: Secondary | ICD-10-CM

## 2015-11-16 DIAGNOSIS — E669 Obesity, unspecified: Secondary | ICD-10-CM

## 2015-11-16 DIAGNOSIS — G629 Polyneuropathy, unspecified: Secondary | ICD-10-CM

## 2015-11-16 DIAGNOSIS — F329 Major depressive disorder, single episode, unspecified: Secondary | ICD-10-CM

## 2015-11-16 DIAGNOSIS — R32 Unspecified urinary incontinence: Secondary | ICD-10-CM

## 2015-11-16 DIAGNOSIS — F32A Depression, unspecified: Secondary | ICD-10-CM

## 2015-11-16 LAB — COMPLETE METABOLIC PANEL WITH GFR
ALT: 10 U/L (ref 6–29)
AST: 13 U/L (ref 10–30)
Albumin: 5 g/dL (ref 3.6–5.1)
Alkaline Phosphatase: 70 U/L (ref 33–115)
BUN: 11 mg/dL (ref 7–25)
CALCIUM: 9.7 mg/dL (ref 8.6–10.2)
CHLORIDE: 102 mmol/L (ref 98–110)
CO2: 23 mmol/L (ref 20–31)
Creat: 0.76 mg/dL (ref 0.50–1.10)
Glucose, Bld: 63 mg/dL — ABNORMAL LOW (ref 65–99)
POTASSIUM: 3.8 mmol/L (ref 3.5–5.3)
Sodium: 140 mmol/L (ref 135–146)
Total Bilirubin: 0.4 mg/dL (ref 0.2–1.2)
Total Protein: 7.8 g/dL (ref 6.1–8.1)

## 2015-11-16 LAB — CBC WITH DIFFERENTIAL/PLATELET
Basophils Absolute: 0 cells/uL (ref 0–200)
Basophils Relative: 0 %
EOS ABS: 99 {cells}/uL (ref 15–500)
Eosinophils Relative: 1 %
HEMATOCRIT: 47.7 % — AB (ref 35.0–45.0)
Hemoglobin: 15.9 g/dL — ABNORMAL HIGH (ref 11.7–15.5)
LYMPHS PCT: 37 %
Lymphs Abs: 3663 cells/uL (ref 850–3900)
MCH: 31.6 pg (ref 27.0–33.0)
MCHC: 33.3 g/dL (ref 32.0–36.0)
MCV: 94.8 fL (ref 80.0–100.0)
MONO ABS: 396 {cells}/uL (ref 200–950)
MPV: 10.5 fL (ref 7.5–12.5)
Monocytes Relative: 4 %
NEUTROS PCT: 58 %
Neutro Abs: 5742 cells/uL (ref 1500–7800)
Platelets: 315 10*3/uL (ref 140–400)
RBC: 5.03 MIL/uL (ref 3.80–5.10)
RDW: 15.3 % — AB (ref 11.0–15.0)
WBC: 9.9 10*3/uL (ref 3.8–10.8)

## 2015-11-16 LAB — TSH: TSH: 2.12 mIU/L

## 2015-11-16 MED ORDER — TRAMADOL HCL 50 MG PO TABS: 100.0000 mg | ORAL_TABLET | Freq: Four times a day (QID) | ORAL | Status: DC | PRN

## 2015-11-16 MED ORDER — GABAPENTIN 300 MG PO CAPS: 300.0000 mg | ORAL_CAPSULE | Freq: Three times a day (TID) | ORAL | Status: DC

## 2015-11-16 MED ORDER — LISINOPRIL 20 MG PO TABS: 20.0000 mg | ORAL_TABLET | Freq: Every day | ORAL | Status: DC

## 2015-11-16 MED ORDER — KETOROLAC TROMETHAMINE 60 MG/2ML IM SOLN
60.0000 mg | Freq: Once | INTRAMUSCULAR | Status: AC
  Administered 2015-11-16: 60 mg via INTRAMUSCULAR

## 2015-11-16 MED FILL — traMADol HCL 50 MG TABS: 50 | 8 days supply | Qty: 60 | Fill #0

## 2015-11-16 MED FILL — GABAPENTIN 300 MG CAPSULE: 300 | 30 days supply | Qty: 90 | Fill #0

## 2015-11-16 MED FILL — LISINOPRIL 20 MG TABLET: 20 | 30 days supply | Qty: 30 | Fill #0

## 2015-11-16 NOTE — Patient Instructions (Addendum)
[place] M-F 8am-3pm   Myofascial Pain Syndrome and Fibromyalgia Myofascial pain syndrome and fibromyalgia are both pain disorders. This pain may be felt mainly in your muscles.   Myofascial pain syndrome:  Always has trigger points or tender points in the muscle that will cause pain when pressed. The pain may come and go.  Usually affects your neck, upper back, and shoulder areas. The pain often radiates into your arms and hands.  Fibromyalgia:  Has muscle pains and tenderness that come and go.  Is often associated with fatigue and sleep disturbances.  Has trigger points.  Tends to be long-lasting (chronic), but is not life-threatening. Fibromyalgia and myofascial pain are not the same. However, they often occur together. If you have both conditions, each can make the other worse. Both are common and can cause enough pain and fatigue to make day-to-day activities difficult.  CAUSES  The exact causes of fibromyalgia and myofascial pain are not known. People with certain gene types may be more likely to develop fibromyalgia. Some factors can be triggers for both conditions, such as:   Spine disorders.  Arthritis.  Severe injury (trauma) and other physical stressors.  Being under a lot of stress.  A medical illness. SIGNS AND SYMPTOMS  Fibromyalgia The main symptom of fibromyalgia is widespread pain and tenderness in your muscles. This can vary over time. Pain is sometimes described as stabbing, shooting, or burning. You may have tingling or numbness, too. You may also have sleep problems and fatigue. You may wake up feeling tired and groggy (fibro fog). Other symptoms may include:   Bowel and bladder problems.  Headaches.  Visual problems.  Problems with odors and noises.  Depression or mood changes.  Painful menstrual periods (dysmenorrhea).  Dry skin or eyes. Myofascial pain  syndrome Symptoms of myofascial pain syndrome include:   Tight, ropy bands of muscle.   Uncomfortable sensations in muscular areas, such as:  Aching.  Cramping.  Burning.  Numbness.  Tingling.   Muscle weakness.  Trouble moving certain muscles freely (range of motion). DIAGNOSIS  There are no specific tests to diagnose fibromyalgia or myofascial pain syndrome. Both can be hard to diagnose because their symptoms are common in many other conditions. Your health care provider may suspect one or both of these conditions based on your symptoms and medical history. Your health care provider will also do a physical exam.  The key to diagnosing fibromyalgia is having pain, fatigue, and other symptoms for more than three months that cannot be explained by another condition.  The key to diagnosing myofascial pain syndrome is finding trigger points in muscles that are tender and cause pain elsewhere in your body (referred pain). TREATMENT  Treating fibromyalgia and myofascial pain often requires a team of health care providers. This usually starts with your primary provider and a physical therapist. You may also find it helpful to work with alternative health care providers, such as massage therapists or acupuncturists. Treatment for fibromyalgia may include medicines. This may include nonsteroidal anti-inflammatory drugs (NSAIDs), along with other medicines.  Treatment for myofascial pain may also include:  NSAIDs.  Cooling and stretching of muscles.  Trigger point injections.  Sound wave (ultrasound) treatments to stimulate muscles. HOME CARE INSTRUCTIONS   Take medicines only as directed by your health care provider.  Exercise as directed by your health care provider or physical therapist.  Try to avoid stressful situations.  Practice relaxation techniques to  control your stress. You may want to try:  Biofeedback.  Visual imagery.  Hypnosis.  Muscle  relaxation.  Yoga.  Meditation.  Talk to your health care provider about alternative treatments, such as acupuncture or massage treatment.  Maintain a healthy lifestyle. This includes eating a healthy diet and getting enough sleep.  Consider joining a support group.  Do not do activities that stress or strain your muscles. That includes repetitive motions and heavy lifting. SEEK MEDICAL CARE IF:   You have new symptoms.  Your symptoms get worse.  You have side effects from your medicines.  You have trouble sleeping.  Your condition is causing depression or anxiety. FOR MORE INFORMATION   National Fibromyalgia Association: http://www.fmaware.orgwww.fmaware.[place]: http://www.arthritis.orgwww.arthritis.org  American Chronic Pain Association: StreetWrestling.at.https://stevens.biz/   This information is not intended to replace advice given to you by your health care provider. Make sure you discuss any questions you have with your health care provider.   Document Released: 06/16/2005 Document Revised: 07/07/2014 Document Reviewed: 03/22/2014 Elsevier Interactive Patient Education 2016 [employer] Chronic Pain Chronic pain can be defined as pain that is off and on and lasts for 3-6 months or longer. Many things cause chronic pain, which can make it difficult to make a diagnosis. There are many treatment options available for chronic pain. However, finding a treatment that works well for you may require trying various approaches until the right one is found. Many people benefit from a combination of two or more types of treatment to control their pain. SYMPTOMS  Chronic pain can occur anywhere in the body and can range from mild to very severe. Some types of chronic pain include:  Headache.  Low back pain.  Cancer pain.  Arthritis pain.  Neurogenic pain. This is pain resulting from damage to  nerves. People with chronic pain may also have other symptoms such as:  Depression.  Anger.  Insomnia.  Anxiety. DIAGNOSIS  Your health care provider will help diagnose your condition over time. In many cases, the initial focus will be on excluding possible conditions that could be causing the pain. Depending on your symptoms, your health care provider may order tests to diagnose your condition. Some of these tests may include:   Blood tests.   CT scan.   MRI.   X-rays.   Ultrasounds.   Nerve conduction studies.  You may need to see a specialist.  TREATMENT  Finding treatment that works well may take time. You may be referred to a pain specialist. He or she may prescribe medicine or therapies, such as:   Mindful meditation or yoga.  Shots (injections) of numbing or pain-relieving medicines into the spine or area of pain.  Local electrical stimulation.  Acupuncture.   Massage therapy.   Aroma, color, light, or sound therapy.   Biofeedback.   Working with a physical therapist to keep from getting stiff.   Regular, gentle exercise.   Cognitive or behavioral therapy.   Group support.  Sometimes, surgery may be recommended.  HOME CARE INSTRUCTIONS   Take all medicines as directed by your health care provider.   Lessen stress in your life by relaxing and doing things such as listening to calming music.   Exercise or be active as directed by your health care provider.   Eat a healthy diet and include things such as vegetables, fruits, fish, and lean meats in your diet.   Keep all follow-up appointments with your health care provider.   Attend a support group  with others suffering from chronic pain. SEEK MEDICAL CARE IF:   Your pain gets worse.   You develop a new pain that was not there before.   You cannot tolerate medicines given to you by your health care provider.   You have new symptoms since your last visit with your health  care provider.  SEEK IMMEDIATE MEDICAL CARE IF:   You feel weak.   You have decreased sensation or numbness.   You lose control of bowel or bladder function.   Your pain suddenly gets much worse.   You develop shaking.  You develop chills.  You develop confusion.  You develop chest pain.  You develop shortness of breath.  MAKE SURE YOU:  Understand these instructions.  Will watch your condition.  Will get help right away if you are not doing well or get worse.   This information is not intended to replace advice given to you by your health care provider. Make sure you discuss any questions you have with your health care provider.   Document Released: 03/08/2002 Document Revised: 02/16/2013 Document Reviewed: 12/10/2012 Elsevier Interactive Patient Education 2016 Elsevier Inc. Neuropathic Pain Neuropathic pain is pain caused by damage to the nerves that are responsible for certain sensations in your body (sensory nerves). The pain can be caused by damage to:   The sensory nerves that send signals to your spinal cord and brain (peripheral nervous system).  The sensory nerves in your brain or spinal cord (central nervous system). Neuropathic pain can make you more sensitive to pain. What would be a minor sensation for most people may feel very painful if you have neuropathic pain. This is usually a long-term condition that can be difficult to treat. The type of pain can differ from person to person. It may start suddenly (acute), or it may develop slowly and last for a long time (chronic). Neuropathic pain may come and go as damaged nerves heal or may stay at the same level for years. It often causes emotional distress, loss of sleep, and a lower quality of life. CAUSES  The most common cause of damage to a sensory nerve is diabetes. Many other diseases and conditions can also cause neuropathic pain. Causes of neuropathic pain can be classified as:  Toxic. Many drugs and  chemicals can cause toxic damage. The most common cause of toxic neuropathic pain is damage from drug treatment for cancer (chemotherapy).  Metabolic. This type of pain can happen when a disease causes imbalances that damage nerves. Diabetes is the most common of these diseases. Vitamin B deficiency caused by long-term alcohol abuse is another common cause.  Traumatic. Any injury that cuts, crushes, or stretches a nerve can cause damage and pain. A common example is feeling pain after losing an arm or leg (phantom limb pain).  Compression-related. If a sensory nerve gets trapped or compressed for a long period of time, the blood supply to the nerve can be cut off.  Vascular. Many blood vessel diseases can cause neuropathic pain by decreasing blood supply and oxygen to nerves.  Autoimmune. This type of pain results from diseases in which the body's defense system mistakenly attacks sensory nerves. Examples of autoimmune diseases that can cause neuropathic pain include lupus and multiple sclerosis.  Infectious. Many types of viral infections can damage sensory nerves and cause pain. Shingles infection is a common cause of this type of pain.  Inherited. Neuropathic pain can be a symptom of many diseases that are passed down through  families (genetic). SIGNS AND SYMPTOMS  The main symptom is pain. Neuropathic pain is often described as:  Burning.  Shock-like.  Stinging.  Hot or cold.  Itching. DIAGNOSIS  No single test can diagnose neuropathic pain. Your health care provider will do a physical exam and ask you about your pain. You may use a pain scale to describe how bad your pain is. You may also have tests to see if you have a high sensitivity to pain and to help find the cause and location of any sensory nerve damage. These tests may include:  Imaging studies, such as:  X-rays.  CT scan.  MRI.  Nerve conduction studies to test how well nerve signals travel through your sensory  nerves (electrodiagnostic testing).  Stimulating your sensory nerves through electrodes on your skin and measuring the response in your spinal cord and brain (somatosensory evoked potentials). TREATMENT  Treatment for neuropathic pain may change over time. You may need to try different treatment options or a combination of treatments. Some options include:  Over-the-counter pain relievers.  Prescription medicines. Some medicines used to treat other conditions may also help neuropathic pain. These include medicines to:  Control seizures (anticonvulsants).  Relieve depression (antidepressants).  Prescription-strength pain relievers (narcotics). These are usually used when other pain relievers do not help.  Transcutaneous nerve stimulation (TENS). This uses electrical currents to block painful nerve signals. The treatment is painless.  Topical and local anesthetics. These are medicines that numb the nerves. They can be injected as a nerve block or applied to the skin.  Alternative treatments, such as:  Acupuncture.  Meditation.  Massage.  Physical therapy.  Pain management programs.  Counseling. HOME CARE INSTRUCTIONS  Learn as much as you can about your condition.  Take medicines only as directed by your health care provider.  Work closely with all your health care providers to find what works best for you.  Have a good support system at home.  Consider joining a chronic pain support group. SEEK MEDICAL CARE IF:  Your pain treatments are not helping.  You are having side effects from your medicines.  You are struggling with fatigue, mood changes, depression, or anxiety.   This information is not intended to replace advice given to you by your health care provider. Make sure you discuss any questions you have with your health care provider.   Document Released: 03/13/2004 Document Revised: 07/07/2014 Document Reviewed: 11/24/2013 Elsevier Interactive Patient  Education 2016 Elsevier Inc. Peripheral Neuropathy Peripheral neuropathy is a type of nerve damage. It affects nerves that carry signals between the spinal cord and other parts of the body. These are called peripheral nerves. With peripheral neuropathy, one nerve or a group of nerves may be damaged.  CAUSES  Many things can damage peripheral nerves. For some people with peripheral neuropathy, the cause is unknown. Some causes include:  Diabetes. This is the most common cause of peripheral neuropathy.  Injury to a nerve.  Pressure or stress on a nerve that lasts a long time.  Too little vitamin B. Alcoholism can lead to this.  Infections.  Autoimmune diseases, such as multiple sclerosis and systemic lupus erythematosus.  Inherited nerve diseases.  Some medicines, such as cancer drugs.  Toxic substances, such as lead and mercury.  Too little blood flowing to the legs.  Kidney disease.  Thyroid disease. SIGNS AND SYMPTOMS  Different people have different symptoms. The symptoms you have will depend on which of your nerves is damaged. Common symptoms include:  Loss of feeling (numbness) in the feet and hands.  Tingling in the feet and hands.  Pain that burns.  Very sensitive skin.  Weakness.  Not being able to move a part of the body (paralysis).  Muscle twitching.  Clumsiness or poor coordination.  Loss of balance.  Not being able to control your bladder.  Feeling dizzy.  Sexual problems. DIAGNOSIS  Peripheral neuropathy is a symptom, not a disease. Finding the cause of peripheral neuropathy can be hard. To figure that out, your health care provider will take a medical history and do a physical exam. A neurological exam will also be done. This involves checking things affected by your brain, spinal cord, and nerves (nervous system). For example, your health care provider will check your reflexes, how you move, and what you can feel.  Other types of tests may  also be ordered, such as:  Blood tests.  A test of the fluid in your spinal cord.  Imaging tests, such as CT scans or an MRI.  Electromyography (EMG). This test checks the nerves that control muscles.  Nerve conduction velocity tests. These tests check how fast messages pass through your nerves.  Nerve biopsy. A small piece of nerve is removed. It is then checked under a microscope. TREATMENT   Medicine is often used to treat peripheral neuropathy. Medicines may include:  Pain-relieving medicines. Prescription or over-the-counter medicine may be suggested.  Antiseizure medicine. This may be used for pain.  Antidepressants. These also may help ease pain from neuropathy.  Lidocaine. This is a numbing medicine. You might wear a patch or be given a shot.  Mexiletine. This medicine is typically used to help control irregular heart rhythms.  Surgery. Surgery may be needed to relieve pressure on a nerve or to destroy a nerve that is causing pain.  Physical therapy to help movement.  Assistive devices to help movement. HOME CARE INSTRUCTIONS   Only take over-the-counter or prescription medicines as directed by your health care provider. Follow the instructions carefully for any given medicines. Do not take any other medicines without first getting approval from your health care provider.  If you have diabetes, work closely with your health care provider to keep your blood sugar under control.  If you have numbness in your feet:  Check every day for signs of injury or infection. Watch for redness, warmth, and swelling.  Wear padded socks and comfortable shoes. These help protect your feet.  Do not do things that put pressure on your damaged nerve.  Do not smoke. Smoking keeps blood from getting to damaged nerves.  Avoid or limit alcohol. Too much alcohol can cause a lack of B vitamins. These vitamins are needed for healthy nerves.  Develop a good support system. Coping with  peripheral neuropathy can be stressful. Talk to a mental health specialist or join a support group if you are struggling.  Follow up with your health care provider as directed. SEEK MEDICAL CARE IF:   You have new signs or symptoms of peripheral neuropathy.  You are struggling emotionally from dealing with peripheral neuropathy.  You have a fever. SEEK IMMEDIATE MEDICAL CARE IF:   You have an injury or infection that is not healing.  You feel very dizzy or begin vomiting.  You have chest pain.  You have trouble breathing.   This information is not intended to replace advice given to you by your health care provider. Make sure you discuss any questions you have with your health care  provider.   Document Released: 06/06/2002 Document Revised: 02/26/2011 Document Reviewed: 02/21/2013 Elsevier Interactive Patient Education [employer].

## 2015-11-16 NOTE — Progress Notes (Signed)
Subjective:    Patient ID: Donna Coleman, female    DOB: Jul 04, 1970, 45 y.o.   MRN: [id number]  HPI Donna Coleman, a 45 year old female with a history of arthritis, neuropathy, hypertnesion, and chronic pain syndrome.  She was a patient of Dr. Nolene Ebbs. She has been lost to follow up due to insurance constraints. She has primarily been using urgent care or the emergency department for all primary needs.   Donna Coleman has a history of hypertension. She has been without antihypertensive medication for greater than 1 month. . She is not exercising and is not adherent to low salt diet.  She does not check blood pressures at home.Patient denies chest pain, dyspnea, fatigue, orthopnea, palpitations, syncope and tachypnea.  Cardiovascular risk factors include  dyslipidemia, obesity (BMI >= 30 kg/m2) and sedentary lifestyle.  Patient also has a history of fibromyalgia. Patient has a several year history of fibromyalgia and chronic pain syndrome.  The symptoms are of severe and generalized severity. They are made worse by: cold exposure, kneeling, lying down, movement, overuse, sitting, standing and walking. They are helped by Oxycodone 15 mg every 6 hours. The patient denies fever, nausea, vomiting, or diarrhea. . Previous treatments include OTC meds and steroid injection. Associated symptoms include depression, fatigue and muscle weakness. Patient denies associated fevers, pleurisy and polydypsia.    She describes symptoms of neuropathy. The onset of symptoms was several years ago.  Symptoms are currently of moderate and generalized severity.  Previous treatment has included Lyrica, which has not improved symptoms.   Past Medical History  Diagnosis Date  . Arthritis     neck, central lumbar area  . Neuropathy ([place])   . Hypertension   . Cervical spine disease 10/2012  . RP (retinitis pigmentosa)   . Fibroid tumor in breast  . Chronic pain syndrome   . Depression   . Vitamin D deficiency    . Myalgia and myositis, unspecified 04/07/2013  . Obesity   . Headache(784.0)   . Dyslipidemia   . Fibromyalgia   . Memory deficits 08/11/2013  . Osteoarthritis    Social History   Social History  . Marital Status: Single    Spouse Name: N/A  . Number of Children: 2  . Years of Education: college   Occupational History  . 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 Smoking status: Former Smoker -- 0.33 packs/day for 12 years    Types: Cigarettes    Quit date: 06/30/2010  . Smokeless tobacco: Never Used     Comment: 1pack per 3 days.   . Alcohol Use: No  . Drug Use: No  . Sexual Activity: Not on file   Other Topics Concern  . Not on file   Social History Narrative  No Known Allergies  There is no immunization history for the selected administration types on file for this patient. Review of Systems  Constitutional: Positive for fatigue.  HENT: Negative.   Eyes: Positive for photophobia.  Respiratory: Negative.  Negative for shortness of breath and wheezing.   Cardiovascular: Negative.  Negative for chest pain, palpitations and leg swelling.  Gastrointestinal: Negative.   Endocrine: Negative.  Negative for polydipsia, polyphagia and polyuria.  Genitourinary: Negative.   Musculoskeletal: Positive for myalgias, back pain, joint swelling, arthralgias and neck stiffness.  Skin: Negative.        Nail fungus  Allergic/Immunologic: Negative.   Neurological: Negative.   Hematological: Negative.  Psychiatric/Behavioral: Positive for agitation. Negative for suicidal ideas and sleep disturbance.       Objective:   Physical Exam  Constitutional: She is oriented to person, place, and time. Vital signs are normal. She appears well-developed and well-nourished.  HENT:  Head: Normocephalic and atraumatic.  Right Ear: External ear normal.  Nose: Nose normal.  Mouth/Throat: Oropharynx is clear and moist.  Eyes: Conjunctivae and EOM are normal. Pupils are  equal, round, and reactive to light.  Neck: Normal range of motion. Neck supple.  Cardiovascular: Normal rate, regular rhythm, normal heart sounds and intact distal pulses.   Pulmonary/Chest: Effort normal and breath sounds normal.  Abdominal: Soft. Bowel sounds are normal.  Musculoskeletal:       Right knee: She exhibits decreased range of motion.       Left knee: She exhibits decreased range of motion.       Cervical back: She exhibits decreased range of motion, tenderness and pain. She exhibits no swelling.       Lumbar back: She exhibits decreased range of motion, tenderness and pain.  Patient ambulating with a cane.   Neurological: She is alert and oriented to person, place, and time. She has normal reflexes.  Skin: Skin is warm and dry.  Psychiatric: She has a normal mood and affect. Her behavior is normal. Judgment and thought content normal.      BP 158/88 mmHg  Pulse 80  Temp(Src) 98.9 F (37.2 C) (Oral)  Resp 16  Ht 5\' 2"  (1.575 m)  Wt 198 lb (89.812 kg)  BMI 36.21 kg/m2  SpO2 100%  LMP 10/13/2015 Assessment & Plan:  1. Essential hypertension Blood pressure is not at goal on current medication regimen - lisinopril (PRINIVIL,ZESTRIL) 20 MG tablet; Take 1 tablet (20 mg total) by mouth daily.  Dispense: 30 tablet; Refill: 1 - Urinalysis, Complete - COMPLETE METABOLIC PANEL WITH GFR  2. Chronic pain syndrome Recommend that patient follows up with pain clinic for chronic pain syndrome. Reviewed [place] Substance Reporting system prior to prescribing opiate medications.   - traMADol (ULTRAM) 50 MG tablet; Take 2 tablets (100 mg total) by mouth every 6 (six) hours as needed.  Dispense: 60 tablet; Refill: 0 - gabapentin (NEURONTIN) 300 MG capsule; Take 1 capsule (300 mg total) by mouth 3 (three) times daily.  Dispense: 90 capsule; Refill: 0 - ketorolac (TORADOL) injection 60 mg; Inject 2 mLs (60 mg total) into the muscle once.  3. Incontinence in female - Urinalysis,  Complete   4. Obesity Recommend a lowfat, low carbohydrate diet divided over 5-6 small meals, increase water intake to 6-8 glasses, and 150 minutes per week of cardiovascular exercise.   - CBC with Differential - Hemoglobin A1c - TSH  5. Fibromyalgia syndrome - gabapentin (NEURONTIN) 300 MG capsule; Take 1 capsule (300 mg total) by mouth 3 (three) times daily.  Dispense: 90 capsule; Refill: 0 - ketorolac (TORADOL) injection 60 mg; Inject 2 mLs (60 mg total) into the muscle once.  6. Depression Patient denies suicidal or homicidal ideations. Recommend that patient follow up with [facility] walk-in clinic to restart medication for depression.   7. Generalized anxiety disorder GAD 7 : Generalized Anxiety Score 11/16/2015  Nervous, Anxious, on Edge 3  Control/stop worrying 2  Worry too much - different things 2  Trouble relaxing 3  Restless 2  Easily annoyed or irritable 2  Afraid - awful might happen 2  Total GAD 7 Score 16  Anxiety Difficulty Somewhat difficult  8. Onychomycosis of toenail Will check liver enzymes prior to prescribing anti-fungal medications.   Routine Maintenance:   Patient refused immunizations Recommend a routine pap smear Will send a referral for mammogram    RTC: 1 month for chronic pain syndrome and neuropathy    [employer] , M-F 8-3

## 2015-11-17 LAB — URINALYSIS, COMPLETE
BILIRUBIN URINE: NEGATIVE
Bacteria, UA: NONE SEEN [HPF]
CRYSTALS: NONE SEEN [HPF]
Casts: NONE SEEN [LPF]
Glucose, UA: NEGATIVE
Hgb urine dipstick: NEGATIVE
KETONES UR: NEGATIVE
Leukocytes, UA: NEGATIVE
Nitrite: NEGATIVE
Protein, ur: NEGATIVE
RBC / HPF: NONE SEEN RBC/HPF (ref <=2)
SPECIFIC GRAVITY, URINE: 1.007 (ref 1.001–1.035)
Squamous Epithelial / LPF: NONE SEEN [HPF] (ref <=5)
WBC UA: NONE SEEN WBC/HPF (ref <=5)
Yeast: NONE SEEN [HPF]
pH: 5.5 (ref 5.0–8.0)

## 2015-11-17 LAB — HEMOGLOBIN A1C
HEMOGLOBIN A1C: 5.5 % (ref <5.7)
MEAN PLASMA GLUCOSE: 111 mg/dL

## 2015-11-29 ENCOUNTER — Telehealth: Payer: Self-pay

## 2015-11-29 NOTE — Telephone Encounter (Signed)
Refill request for Tramadol. Patient says she was under the impression that the rx she received on 11/16/2015 should have been for 90 tablets, however it was only written for 60. She is also asking about a pain management referral, however she only has the orange card. Any other suggestions? Please advise. Thanks!  LOV 11/16/2015

## 2015-11-30 NOTE — Telephone Encounter (Signed)
Patient return call and I advised of [place]'s instructions. She verbally states understanding and will try to apply for charity care. Thanks!

## 2015-11-30 NOTE — Telephone Encounter (Signed)
Tried to Call patient, no answer. Left message asking patient to return call and left callback number. Thanks!

## 2015-11-30 NOTE — Telephone Encounter (Signed)
-----   Message from Dorena Dew, [place] sent at 11/30/2015 10:31 AM EDT ----- Apply for [facility]. Please inform patient that we discussed the fact that I do not treat chronic pain at length. I gave her a short prescription of Tramadol for moderate to severe pain. She is welcome to call previous provider for pain management to find if they will continue to see her.

## 2015-12-03 ENCOUNTER — Other Ambulatory Visit: Payer: Self-pay | Admitting: Internal Medicine

## 2015-12-03 ENCOUNTER — Other Ambulatory Visit: Payer: Self-pay

## 2015-12-03 DIAGNOSIS — E2839 Other primary ovarian failure: Secondary | ICD-10-CM

## 2015-12-03 DIAGNOSIS — M79622 Pain in left upper arm: Secondary | ICD-10-CM

## 2015-12-05 ENCOUNTER — Other Ambulatory Visit: Payer: Self-pay | Admitting: Internal Medicine

## 2015-12-05 DIAGNOSIS — M79622 Pain in left upper arm: Secondary | ICD-10-CM

## 2015-12-17 ENCOUNTER — Other Ambulatory Visit: Payer: Self-pay | Admitting: Internal Medicine

## 2015-12-17 ENCOUNTER — Ambulatory Visit
Admission: RE | Admit: 2015-12-17 | Discharge: 2015-12-17 | Disposition: A | Discharge status: Home / Self Care | Payer: No Typology Code available for payment source | Source: Ambulatory Visit | Attending: Internal Medicine | Admitting: Internal Medicine

## 2015-12-17 DIAGNOSIS — M79622 Pain in left upper arm: Secondary | ICD-10-CM

## 2015-12-17 DIAGNOSIS — E2839 Other primary ovarian failure: Secondary | ICD-10-CM

## 2015-12-24 ENCOUNTER — Ambulatory Visit (INDEPENDENT_AMBULATORY_CARE_PROVIDER_SITE_OTHER): Payer: No Typology Code available for payment source | Admitting: Family Medicine

## 2015-12-24 ENCOUNTER — Encounter: Payer: Self-pay | Admitting: Family Medicine

## 2015-12-24 VITALS — BP 154/90 | HR 74 | Temp 98.4°F | Resp 16 | Ht 62.5 in | Wt 194.0 lb

## 2015-12-24 DIAGNOSIS — I10 Essential (primary) hypertension: Secondary | ICD-10-CM

## 2015-12-24 DIAGNOSIS — M797 Fibromyalgia: Secondary | ICD-10-CM

## 2015-12-24 DIAGNOSIS — F418 Other specified anxiety disorders: Secondary | ICD-10-CM

## 2015-12-24 DIAGNOSIS — K0889 Other specified disorders of teeth and supporting structures: Secondary | ICD-10-CM | POA: Insufficient documentation

## 2015-12-24 DIAGNOSIS — K0510 Chronic gingivitis, plaque induced: Secondary | ICD-10-CM

## 2015-12-24 DIAGNOSIS — R634 Abnormal weight loss: Secondary | ICD-10-CM

## 2015-12-24 DIAGNOSIS — G894 Chronic pain syndrome: Secondary | ICD-10-CM

## 2015-12-24 MED ORDER — KETOROLAC TROMETHAMINE 60 MG/2ML IM SOLN
60.0000 mg | Freq: Once | INTRAMUSCULAR | Status: AC
  Administered 2015-12-24: 60 mg via INTRAMUSCULAR

## 2015-12-24 MED ORDER — IBUPROFEN 800 MG PO TABS: 800.0000 mg | ORAL_TABLET | Freq: Three times a day (TID) | ORAL | Status: DC | PRN

## 2015-12-24 MED ORDER — GABAPENTIN 400 MG PO CAPS: 400.0000 mg | ORAL_CAPSULE | Freq: Four times a day (QID) | ORAL | Status: DC

## 2015-12-24 MED ORDER — CLINDAMYCIN HCL 300 MG PO CAPS: 300.0000 mg | ORAL_CAPSULE | Freq: Three times a day (TID) | ORAL | Status: DC

## 2015-12-24 MED FILL — GABAPENTIN 400 MG CAPSULE: 400 | 30 days supply | Qty: 120 | Fill #0

## 2015-12-24 MED FILL — IBUPROFEN 800 MG TABLET: 800 | 30 days supply | Qty: 30 | Fill #0

## 2015-12-24 MED FILL — CLINDAMYCIN HCL 300 MG CAP: 300 | 10 days supply | Qty: 30 | Fill #0

## 2015-12-24 NOTE — Progress Notes (Signed)
Subjective:    Patient ID: Donna Coleman, Coleman    DOB: 01/16/1971, 45 y.o.   MRN: [id number]  HPI Donna Coleman with a history of arthritis, neuropathy, hypertnesion, and chronic pain syndrome presents for a 1 month follow up. Donna Coleman has a history of hypertension. She has been taking Lisinopril 20 mg consistently over the past several weeks. She is not exercising and is not adherent to low salt diet.  She does not check blood pressures at home.Patient denies chest pain, dyspnea, fatigue, orthopnea, palpitations, syncope and tachypnea.  Cardiovascular risk factors include  dyslipidemia, obesity (BMI >= 30 kg/m2) and sedentary lifestyle.   Patient also has a history of fibromyalgia. Patient has a several year history of fibromyalgia and chronic pain syndrome.  The symptoms are of severe and generalized severity. They are made worse by: cold exposure, kneeling, lying down, movement, overuse, sitting, standing and walking. Pain intensity has improved moderately with Gabapentin. The patient denies fever, nausea, vomiting, or diarrhea. . Previous treatments include OTC meds and steroid injection. Associated symptoms include depression, fatigue and muscle weakness. Patient denies associated fevers, pleurisy and polydypsia.   She describes symptoms of neuropathy. The onset of symptoms was several years ago.  Symptoms are currently of moderate and generalized severity.   Past Medical History  Diagnosis Date  . Arthritis     neck, central lumbar area  . Neuropathy ([place])   . Hypertension   . Cervical spine disease 10/2012  . RP (retinitis pigmentosa)   . Fibroid tumor in breast  . Chronic pain syndrome   . Depression   . Vitamin D deficiency   . Myalgia and myositis, unspecified 04/07/2013  . Obesity   . Headache(784.0)   . Dyslipidemia   . Fibromyalgia   . Memory deficits 08/11/2013  . Osteoarthritis    Social History   Social History  . Marital Status: Single     Spouse Name: N/A  . Number of Children: 2  . Years of Education: college   Occupational History  . 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 Smoking status: Former Smoker -- 0.33 packs/day for 12 years    Types: Cigarettes    Quit date: 06/30/2010  . Smokeless tobacco: Never Used     Comment: 1pack per 3 days.   . Alcohol Use: No  . Drug Use: No  . Sexual Activity: Not on file   Other Topics Concern  . Not on file   Social History Narrative  No Known Allergies  There is no immunization history for the selected administration types on file for this patient. Review of Systems  Constitutional: Positive for fatigue.  HENT: Positive for dental problem (gum inflammation, she did not pick up antibiotic as previously prescribed).   Eyes: Positive for photophobia.  Respiratory: Negative.  Negative for shortness of breath and wheezing.   Cardiovascular: Negative.  Negative for chest pain, palpitations and leg swelling.  Gastrointestinal: Negative.   Endocrine: Negative.  Negative for polydipsia, polyphagia and polyuria.  Genitourinary: Negative.   Musculoskeletal: Positive for myalgias, back pain, joint swelling, arthralgias and neck stiffness.  Skin: Negative.        Nail fungus  Allergic/Immunologic: Negative.   Neurological: Negative.   Hematological: Negative.   Psychiatric/Behavioral: Positive for agitation. Negative for suicidal ideas and sleep disturbance.       Objective:   Physical Exam  Constitutional: She is oriented to  person, place, and time. Vital signs are normal. She appears well-developed and well-nourished.  HENT:  Head: Normocephalic and atraumatic.  Right Ear: External ear normal.  Nose: Nose normal.  Mouth/Throat: Oropharynx is clear and moist. Abnormal dentition. Dental caries (gum erythema and inflammation) present.  Eyes: Conjunctivae and EOM are normal. Pupils are equal, round, and reactive to light.  Neck: Normal range of  motion. Neck supple.  Cardiovascular: Normal rate, regular rhythm, normal heart sounds and intact distal pulses.   Pulmonary/Chest: Effort normal and breath sounds normal.  Abdominal: Soft. Bowel sounds are normal.  Musculoskeletal:       Right knee: She exhibits decreased range of motion.       Left knee: She exhibits decreased range of motion.       Cervical back: She exhibits decreased range of motion, tenderness and pain. She exhibits no swelling.       Lumbar back: She exhibits decreased range of motion, tenderness and pain.  Patient ambulating with a cane.   Neurological: She is alert and oriented to person, place, and time. She has normal reflexes.  Skin: Skin is warm and dry.  Psychiatric: She has a normal mood and affect. Her behavior is normal. Judgment and thought content normal.      BP 154/90 mmHg  Pulse 74  Temp(Src) 98.4 F (36.9 C) (Oral)  Resp 16  Ht 5' 2.5" (1.588 m)  Wt 194 lb (87.998 kg)  BMI 34.90 kg/m2  SpO2 100%  LMP 12/19/2015 Assessment & Plan:  1. Essential hypertension Blood pressure is not at goal on current medication regimen. She did not take Lisinopril prior to arrival. Will continue medication at current dosage.  - lisinopril (PRINIVIL,ZESTRIL) 20 MG tablet; Take 1 tablet (20 mg total) by mouth daily.  Dispense: 30 tablet; Refill: 1 - 2. Chronic pain syndrome - gabapentin (NEURONTIN) 400 MG capsule; Take 1 capsule (400 mg total) by mouth 4 (four) times daily.  Dispense: 120 capsule; Refill: 3 - ketorolac (TORADOL) injection 60 mg; Inject 2 mLs (60 mg total) into the muscle once.  3. Fibromyalgia syndrome - gabapentin (NEURONTIN) 400 MG capsule; Take 1 capsule (400 mg total) by mouth 4 (four) times daily.  Dispense: 120 capsule; Refill: 3 - ketorolac (TORADOL) injection 60 mg; Inject 2 mLs (60 mg total) into the muscle once.  4. Pain, dental Referral sent to dental services.  - ibuprofen (ADVIL,MOTRIN) 800 MG tablet; Take 1 tablet (800 mg total)  by mouth every 8 (eight) hours as needed.  Dispense: 30 tablet; Refill: 0  5. Gum inflammation - clindamycin (CLEOCIN) 300 MG capsule; Take 1 capsule (300 mg total) by mouth 3 (three) times daily.  Dispense: 30 capsule; Refill: 0  6. Loss of weight - Hepatitis panel, acute - HIV antibody (with reflex)  Routine Maintenance:   Patient refused immunizations Recommend a routine pap smear Will send a referral for mammogram    RTC: 3 months for hypertension and chronic pain syndrome   Hollis,Lachina M, FNP

## 2015-12-25 LAB — HEPATITIS PANEL, ACUTE
HCV AB: NEGATIVE
HEP A IGM: NONREACTIVE
HEP B C IGM: NONREACTIVE
HEP B S AG: NEGATIVE

## 2015-12-25 LAB — HIV ANTIBODY (ROUTINE TESTING W REFLEX): HIV 1&2 Ab, 4th Generation: NONREACTIVE

## 2016-02-25 MED FILL — GABAPENTIN 400 MG CAPSULE: 400 | 30 days supply | Qty: 120 | Fill #1

## 2016-03-24 ENCOUNTER — Ambulatory Visit: Payer: No Typology Code available for payment source | Admitting: Family Medicine

## 2016-05-07 ENCOUNTER — Telehealth: Payer: Self-pay

## 2016-05-07 DIAGNOSIS — G894 Chronic pain syndrome: Secondary | ICD-10-CM

## 2016-05-07 DIAGNOSIS — M797 Fibromyalgia: Secondary | ICD-10-CM

## 2016-05-07 MED ORDER — GABAPENTIN 400 MG PO CAPS: 400.0000 mg | ORAL_CAPSULE | Freq: Four times a day (QID) | ORAL | 3 refills | Status: AC

## 2016-05-07 NOTE — Telephone Encounter (Signed)
This has been sent into pharmacy. Thanks!  

## 2016-05-14 ENCOUNTER — Encounter: Payer: Self-pay | Admitting: Family Medicine

## 2016-05-14 ENCOUNTER — Ambulatory Visit (INDEPENDENT_AMBULATORY_CARE_PROVIDER_SITE_OTHER): Payer: Self-pay | Admitting: Family Medicine

## 2016-05-14 VITALS — BP 153/90 | HR 75 | Temp 98.5°F | Resp 16 | Ht 62.5 in | Wt 186.0 lb

## 2016-05-14 DIAGNOSIS — G894 Chronic pain syndrome: Secondary | ICD-10-CM

## 2016-05-14 DIAGNOSIS — Z23 Encounter for immunization: Secondary | ICD-10-CM

## 2016-05-14 DIAGNOSIS — Z0000 Encounter for general adult medical examination without abnormal findings: Secondary | ICD-10-CM

## 2016-05-14 LAB — CBC WITH DIFFERENTIAL/PLATELET
BASOS PCT: 0 %
Basophils Absolute: 0 cells/uL (ref 0–200)
EOS ABS: 68 {cells}/uL (ref 15–500)
Eosinophils Relative: 1 %
HEMATOCRIT: 47.8 % — AB (ref 35.0–45.0)
HEMOGLOBIN: 15.9 g/dL — AB (ref 11.7–15.5)
LYMPHS ABS: 1836 {cells}/uL (ref 850–3900)
Lymphocytes Relative: 27 %
MCH: 31.9 pg (ref 27.0–33.0)
MCHC: 33.3 g/dL (ref 32.0–36.0)
MCV: 96 fL (ref 80.0–100.0)
MONO ABS: 340 {cells}/uL (ref 200–950)
MPV: 11 fL (ref 7.5–12.5)
Monocytes Relative: 5 %
NEUTROS PCT: 67 %
Neutro Abs: 4556 cells/uL (ref 1500–7800)
Platelets: 340 10*3/uL (ref 140–400)
RBC: 4.98 MIL/uL (ref 3.80–5.10)
RDW: 14.6 % (ref 11.0–15.0)
WBC: 6.8 10*3/uL (ref 3.8–10.8)

## 2016-05-14 LAB — COMPLETE METABOLIC PANEL WITH GFR
ALBUMIN: 4.5 g/dL (ref 3.6–5.1)
ALK PHOS: 72 U/L (ref 33–115)
ALT: 11 U/L (ref 6–29)
AST: 14 U/L (ref 10–35)
BILIRUBIN TOTAL: 0.3 mg/dL (ref 0.2–1.2)
BUN: 10 mg/dL (ref 7–25)
CALCIUM: 10 mg/dL (ref 8.6–10.2)
CO2: 26 mmol/L (ref 20–31)
CREATININE: 0.83 mg/dL (ref 0.50–1.10)
Chloride: 103 mmol/L (ref 98–110)
GFR, Est Non African American: 85 mL/min (ref >=60)
Glucose, Bld: 69 mg/dL (ref 65–99)
Potassium: 4.6 mmol/L (ref 3.5–5.3)
Sodium: 140 mmol/L (ref 135–146)
Total Protein: 7.6 g/dL (ref 6.1–8.1)

## 2016-05-14 LAB — LIPID PANEL
CHOL/HDL RATIO: 4.8 ratio (ref <5.0)
Cholesterol: 198 mg/dL (ref <200)
HDL: 41 mg/dL — AB (ref >50)
LDL CALC: 141 mg/dL — AB (ref <100)
TRIGLYCERIDES: 79 mg/dL (ref <150)
VLDL: 16 mg/dL (ref <30)

## 2016-05-14 LAB — TSH: TSH: 0.31 m[IU]/L — AB

## 2016-05-14 MED ORDER — KETOROLAC TROMETHAMINE 30 MG/ML IJ SOLN
30.0000 mg | Freq: Once | INTRAMUSCULAR | Status: AC
  Administered 2016-05-14: 30 mg via INTRAMUSCULAR

## 2016-05-14 NOTE — Patient Instructions (Signed)
Take BP med regularly Make an appt for PAP smear soon.

## 2016-05-14 NOTE — Progress Notes (Signed)
Donna Coleman, is a 45 y.o. female  [id number]  [id number]  DOB - 12-18-1970  CC:  Chief Complaint  Patient presents with  . Hypertension  . Follow-up    patient states she has cronice fever and swelling in lower extremities.   . Pain    pain all over body wants to be checked for Cancer.          HPI: Donna Coleman is a 45 y.o. female here for  follow-up chronic conditions. She has chronic pain, DDD, fibromyalgia, neuropathy, osteoarthritis and is followed and treatment at pain clinic. She has depression and anxiety but is currently is on no medications. She reports that she hurts all over and wants to be screened for cancer (all cancer). She would like a total body MRI. She has  Hypertension and dyslipidemia and vitamin D deficiency.   I have explained but multiple musculoskeletal diagnoses account for her widespread pain and there is nothing to suggest cancer. She does need a PAP smear for cervical cancer screening. She has had a recent negative mammogram. I have explained the bloodwork we are doing would give an indication of some types of cancer if present. She is particularly concerned about bone cancer.    No Known Allergies Past Medical History:  Diagnosis Date  . Arthritis    neck, central lumbar area  . Cervical spine disease 10/2012  . Chronic pain syndrome   . Depression   . Dyslipidemia   . Fibroid tumor in breast  . Fibromyalgia   . Headache(784.0)   . Hypertension   . Memory deficits 08/11/2013  . Myalgia and myositis, unspecified 04/07/2013  . Neuropathy ([place])   . Obesity   . Osteoarthritis   . RP (retinitis pigmentosa)   . Vitamin D deficiency    Current Outpatient Prescriptions on File Prior to Visit  Medication Sig Dispense Refill  . carisoprodol (SOMA) 350 MG tablet Take 350 mg by mouth 3 (three) times daily as needed for muscle spasms. Reported on 12/24/2015    . Cholecalciferol (RA VITAMIN D-3) 1000 UNITS tablet Take 1,000 Units by mouth daily.  Reported on 12/24/2015    . clobetasol cream (TEMOVATE) [id number] % Apply 1 application topically 2 (two) times daily. Reported on 12/24/2015    . gabapentin (NEURONTIN) 400 MG capsule Take 1 capsule (400 mg total) by mouth 4 (four) times daily. 120 capsule 3  . lisinopril (PRINIVIL,ZESTRIL) 20 MG tablet Take 1 tablet (20 mg total) by mouth daily. 30 tablet 1  . oxyCODONE (ROXICODONE) 15 MG immediate release tablet Take 1 tablet (15 mg total) by mouth every 4 (four) hours as needed for pain. 30 tablet 0  . Vitamin D, Ergocalciferol, (DRISDOL) 50000 UNITS CAPS capsule Reported on 12/24/2015  0  . ABILIFY 10 MG tablet Take 10 mg by mouth daily. Reported on 12/24/2015  0  . ALPRAZolam (XANAX) 1 MG tablet 1 mg daily as needed. Reported on 12/24/2015  0  . busPIRone (BUSPAR) 15 MG tablet 15 mg 3 (three) times daily.   0  . butalbital-acetaminophen-caffeine (FIORICET) 50-325-40 MG per tablet Take 1 tablet by mouth 4 (four) times daily as needed for headache. Reported on 12/24/2015    . cycloSPORINE (RESTASIS) 0.05 % ophthalmic emulsion Place 1 drop into both eyes 2 (two) times daily as needed (for eye irritation). Reported on 12/24/2015    . FLUoxetine HCl 60 MG TABS Take 1 tablet by mouth at bedtime. Take one tablet by mouth once daily at bedtime  for axiety (Patient not taking: Reported on 05/14/2016) 30 tablet 0  . ibuprofen (ADVIL,MOTRIN) 800 MG tablet Take 1 tablet (800 mg total) by mouth every 8 (eight) hours as needed. (Patient not taking: Reported on 05/14/2016) 30 tablet 0  . lidocaine (LIDODERM) 5 % Place 1 patch onto the skin daily. Remove & Discard patch within 12 hours or as directed by MD (Patient not taking: Reported on 05/14/2016) 30 patch 0  . methocarbamol (ROBAXIN) 500 MG tablet Take 1 tablet (500 mg total) by mouth 2 (two) times daily as needed for muscle spasms. (Patient not taking: Reported on 05/14/2016) 20 tablet 0  . traMADol (ULTRAM) 50 MG tablet Take 2 tablets (100 mg total) by mouth every 6  (six) hours as needed. (Patient not taking: Reported on 05/14/2016) 60 tablet 0   No current facility-administered medications on file prior to visit.    Family History  Problem Relation Age of Onset  . Diabetes Maternal Grandmother   . Alzheimer's disease Maternal Grandmother    Social History   Social History  . Marital status: Single    Spouse name: N/A  . Number of children: 2  . 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 Smoking status: Former Smoker    Packs/day: 0.33    Years: 12.00    Types: Cigarettes    Quit date: 06/30/2010  . Smokeless tobacco: Never Used     Comment: 1pack per 3 days.   . Alcohol use No  . Drug use: No  . Sexual activity: Not on file   Other Topics Concern  . Not on file   Social History Narrative  . No narrative on file    Review of Systems: Constitutional: Positive for fatigue Skin: Negative for ulcerations.Positive for rash on feet Eyes: Negative pain, visual disturbance. Positive for retinitis pigmentosa Respiratory: Negative for cough, shortness of breath,   Cardiovascular: Postive  for chest pain, palpitations, pedal edema  Abdominal:Negative for nausea, vomiting, diarhea,constipation. Positive for abd pain  Genitourinary: Negative for frequency, urgency, polyuria Musculoskeletal: Postive for back pain, hip pain, knee pain, shoulder pain, neck pain, swelling of multiple joints Neurological: Negative for numbness, tingling,pain in feet. Positive for headaches Psychiatric/Behavioral: Negative for depression, anxiety, confusion   Objective:   Vitals:   05/14/16 0813  BP: (!) 153/90  Pulse: 75  Resp: 16  Temp: 98.5 F (36.9 C)    Physical Exam: Constitutional: Patient appears well-developed and well-nourished. No distress. HENT: Normocephalic, atraumatic. Oropharynx is clear and moist.  Eyes: Conjunctivae and EOM are normal. PERRLA, no scleral  icterus. Neck: Normal ROM. Neck supple. No lymphadenopathy, No thyromegaly. CVS: RRR, S1/S2 +, no murmurs, no gallops, no rubs Pulmonary: Effort and breath sounds normal, no stridor, rhonchi, wheezes, rales.  Abdominal: Soft. Normoactive BS,, no distension, tenderness, rebound or guarding.  Musculoskeletal: She walks with a cane. She yells in pain no matter where I lightly tap, back, legs, arms, abd. Neuro: Alert.Normal muscle tone coordination. Non-focal Skin: Skin is warm and dry. No rash noted. Not diaphoretic. No erythema. No pallor. Psychiatric: Normal mood and affect.  Lab Results  Component Value Date   WBC 9.9 11/16/2015   HGB 15.9 (H) 11/16/2015   HCT 47.7 (H) 11/16/2015   MCV 94.8 11/16/2015   PLT 315 11/16/2015   Lab Results  Component Value Date   CREATININE 0.76 11/16/2015   BUN 11 11/16/2015  NA 140 11/16/2015   K 3.8 11/16/2015   CL 102 11/16/2015   CO2 23 11/16/2015    Lab Results  Component Value Date   HGBA1C 5.5 11/16/2015   Lipid Panel  No results found for: CHOL, TRIG, HDL, CHOLHDL, VLDL, LDLCALC      Assessment and plan:   Diabetes  -A1C -Continue diabetic medications -Continue ACE for renal protection -Continue statin for reduction of cardiac risks -Follow-up in 3 months for recheck   1. Chronic pain syndrome  - ketorolac (TORADOL) 30 MG/ML injection 30 mg; Inject 1 mL (30 mg total) into the muscle once.  2. Need for Tdap vaccination -Tdap vaccine   Health Maintenance .CBC with Differential - COMPLETE METABOLIC PANEL WITH GFR - Lipid panel - TSH   Return in about 6 months (around 11/11/2016).  The patient was given clear instructions to go to ER or return to medical center if symptoms don't improve, worsen or new problems develop. The patient verbalized understanding       05/14/2016, 3:35 PM

## 2016-06-13 ENCOUNTER — Ambulatory Visit: Payer: Self-pay | Admitting: Family Medicine

## 2016-09-04 ENCOUNTER — Ambulatory Visit (INDEPENDENT_AMBULATORY_CARE_PROVIDER_SITE_OTHER): Payer: BLUE CROSS/BLUE SHIELD

## 2016-09-04 ENCOUNTER — Encounter (HOSPITAL_COMMUNITY): Payer: Self-pay | Admitting: *Deleted

## 2016-09-04 ENCOUNTER — Ambulatory Visit (HOSPITAL_COMMUNITY)
Admission: EM | Admit: 2016-09-04 | Discharge: 2016-09-04 | Disposition: A | Discharge status: Home / Self Care | Payer: BLUE CROSS/BLUE SHIELD | Attending: Internal Medicine | Admitting: Internal Medicine

## 2016-09-04 DIAGNOSIS — S025XXA Fracture of tooth (traumatic), initial encounter for closed fracture: Secondary | ICD-10-CM

## 2016-09-04 DIAGNOSIS — K029 Dental caries, unspecified: Secondary | ICD-10-CM

## 2016-09-04 MED ORDER — AMOXICILLIN 500 MG PO CAPS: 500.0000 mg | ORAL_CAPSULE | Freq: Three times a day (TID) | ORAL | 0 refills | Status: DC

## 2016-09-04 NOTE — ED Notes (Signed)
Pt needed to leave bc her transportation was leaving... Did not have time to put on knee sleeve... 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

## 2016-09-04 NOTE — ED Triage Notes (Signed)
Patient reports she was eating yesterday a dorito and left bottom molar cracked, patient with pain and swelling noted.   Patient also states that she was walking down stairs 3 days ago and injured right knee. Patient states she has had pain with ambulation and knee has been "giving out", states she can't bend it.

## 2016-09-04 NOTE — ED Provider Notes (Signed)
CSN: [id number]     Arrival date & time 09/04/16  1030 History   First MD Initiated Contact with Patient 09/04/16 1110     Chief Complaint  Patient presents with  . Dental Pain  . Knee Pain   (Consider location/radiation/quality/duration/timing/severity/associated sxs/prior Treatment) HPI  Donna Coleman is a 46 y.o. female presenting to UC with two separate complaints, tooth pain and Right knee pain.  Tooth pain: started yesterday after eating a dorito.  Pt reports her Left bottom molar is cracked. Pain is aching and sore. She then developed Left side facial swelling and subjective fever. She does have a dentist but cannot f/u for about 3 weeks.  Pain is aching nad throbbing, 10/10.   Right knee pain: hx of chronic knee pain, exacerbated after falling down stairs 3 days ago.  Pain is aching and sore, worse with ambulation. She is seen by a pain specialist and takes oxycodone but not helping.  She was told by her pain specialist she would be given a knee brace but is waiting for them to call back to schedule an appointment for the brace.  Pt notes she had to miss work today due to the knee pain.   Past Medical History:  Diagnosis Date  . Arthritis    neck, central lumbar area  . Cervical spine disease 10/2012  . Chronic pain syndrome   . Depression   . Dyslipidemia   . Fibroid tumor in breast  . Fibromyalgia   . Headache(784.0)   . Hypertension   . Memory deficits 08/11/2013  . Myalgia and myositis, unspecified 04/07/2013  . Neuropathy ([place])   . Obesity   . Osteoarthritis   . RP (retinitis pigmentosa)   . Vitamin D deficiency    Past Surgical History:  Procedure Laterality Date  . TUBAL LIGATION     Family History  Problem Relation Age of Onset  . Diabetes Maternal Grandmother   . Alzheimer's disease Maternal Grandmother    Social History  Substance Use Topics  . Smoking status: Former Smoker    Packs/day: 0.33    Years: 12.00    Types: Cigarettes    Quit date:  06/30/2010  . Smokeless tobacco: Never Used     Comment: 1pack per 3 days.   . Alcohol use No   OB History    No data available     Review of Systems  Constitutional: Positive for fever (subjective). Negative for chills.  HENT: Positive for dental problem (Left lower molar) and facial swelling (Left lower side). Negative for congestion, rhinorrhea and sore throat.   Musculoskeletal: Positive for arthralgias, gait problem ( uses a cane), joint swelling and myalgias. Negative for back pain, neck pain and neck stiffness.  Skin: Negative for color change and wound.    Allergies  Patient has no known allergies.  Home Medications   Prior to Admission medications   Medication Sig Start Date End Date Taking? Authorizing Provider  gabapentin (NEURONTIN) 400 MG capsule Take 1 capsule (400 mg total) by mouth 4 (four) times daily. 05/07/16  Yes Micheline Chapman, NP  lisinopril (PRINIVIL,ZESTRIL) 20 MG tablet Take 1 tablet (20 mg total) by mouth daily. 11/16/15  Yes Dorena Dew, FNP  oxyCODONE (ROXICODONE) 15 MG immediate release tablet Take 1 tablet (15 mg total) by mouth every 4 (four) hours as needed for pain. Patient taking differently: Take 20 mg by mouth every 4 (four) hours as needed for pain.  07/06/15  Yes Konrad Felix,  PA  ABILIFY 10 MG tablet Take 10 mg by mouth daily. Reported on 12/24/2015 01/15/15   Historical Provider, MD  ALPRAZolam Duanne Moron) 1 MG tablet 1 mg daily as needed. Reported on 12/24/2015 02/17/15   Historical Provider, MD  amoxicillin (AMOXIL) 500 MG capsule Take 1 capsule (500 mg total) by mouth 3 (three) times daily. 09/04/16   Noland Fordyce, PA-C  busPIRone (BUSPAR) 15 MG tablet 15 mg 3 (three) times daily.  01/08/15   Historical Provider, MD  butalbital-acetaminophen-caffeine (FIORICET) 50-325-40 MG per tablet Take 1 tablet by mouth 4 (four) times daily as needed for headache. Reported on 12/24/2015    Historical Provider, MD  carisoprodol (SOMA) 350 MG tablet Take 350 mg  by mouth 3 (three) times daily as needed for muscle spasms. Reported on 12/24/2015    Historical Provider, MD  Cholecalciferol (RA VITAMIN D-3) 1000 UNITS tablet Take 1,000 Units by mouth daily. Reported on 12/24/2015    Historical Provider, MD  clobetasol cream (TEMOVATE) 4.33 % Apply 1 application topically 2 (two) times daily. Reported on 12/24/2015    Historical Provider, MD  cycloSPORINE (RESTASIS) 0.05 % ophthalmic emulsion Place 1 drop into both eyes 2 (two) times daily as needed (for eye irritation). Reported on 12/24/2015    Historical Provider, MD  FLUoxetine HCl 60 MG TABS Take 1 tablet by mouth at bedtime. Take one tablet by mouth once daily at bedtime for axiety Patient not taking: Reported on 05/14/2016 06/15/15   Jule Ser, DO  ibuprofen (ADVIL,MOTRIN) 800 MG tablet Take 1 tablet (800 mg total) by mouth every 8 (eight) hours as needed. Patient not taking: Reported on 05/14/2016 12/24/15   Dorena Dew, FNP  lidocaine (LIDODERM) 5 % Place 1 patch onto the skin daily. Remove & Discard patch within 12 hours or as directed by MD Patient not taking: Reported on 05/14/2016 05/07/14   Delos Haring, PA-C  methocarbamol (ROBAXIN) 500 MG tablet Take 1 tablet (500 mg total) by mouth 2 (two) times daily as needed for muscle spasms. Patient not taking: Reported on 05/14/2016 07/17/15   Waynetta Pean, PA-C  traMADol (ULTRAM) 50 MG tablet Take 2 tablets (100 mg total) by mouth every 6 (six) hours as needed. Patient not taking: Reported on 05/14/2016 11/16/15   Dorena Dew, FNP  Vitamin D, Ergocalciferol, (DRISDOL) 50000 UNITS CAPS capsule Reported on 12/24/2015 01/08/15   Historical Provider, MD   Meds Ordered and Administered this Visit  Medications - No data to display  BP 159/97 (BP Location: Left Arm)   Pulse 78   Temp 98.3 F (36.8 C) (Oral)   Resp 18   LMP 09/04/2016 (Exact Date)   SpO2 100%  No data found.   Physical Exam  Constitutional: She is oriented to person, place,  and time. She appears well-developed and well-nourished. No distress.  HENT:  Head: Normocephalic and atraumatic.  Mouth/Throat: Uvula is midline, oropharynx is clear and moist and mucous membranes are normal. No trismus in the jaw. Abnormal dentition. Dental abscesses and dental caries present.    Receding gumline, gingival erythema and edema. Cracked tooth along gumline. Tender.  Mild edema to Left lower side of face. Tender.   Eyes: EOM are normal.  Neck: Normal range of motion.  Cardiovascular: Normal rate.   Pulmonary/Chest: Effort normal.  Musculoskeletal: Normal range of motion. She exhibits edema and tenderness.  Right knee: mild edema, full ROM. Tenderness to medial and lateral aspect. No crepitus.  Calf is soft, non-tender.   Neurological: She  is alert and oriented to person, place, and time.  Skin: Skin is warm and dry. She is not diaphoretic.  Psychiatric: She has a normal mood and affect. Her behavior is normal.  Nursing note and vitals reviewed.   Urgent Care Course     Procedures (including critical care time)  Labs Review Labs Reviewed - No data to display  Imaging Review Dg Knee Complete 4 Views Right  Result Date: 09/04/2016 CLINICAL DATA:  Right knee pain.  Fall on stairs 3 days ago. EXAM: RIGHT KNEE - COMPLETE 4+ VIEW COMPARISON:  None. FINDINGS: Mild degenerative changes in the medial and patellofemoral compartments with joint space narrowing and spurring. Moderate joint effusion. No acute bony abnormality. Specifically, no fracture, subluxation, or dislocation. Soft tissues are intact. IMPRESSION: Mild osteoarthritis with moderate joint effusion. No acute bony abnormality. Electronically Signed   By: Rolm Baptise M.D.   On: 09/04/2016 11:40      MDM   1. Closed fracture of tooth, initial encounter   2. Pain due to dental caries    Pt c/o tooth pain from a cracked tooth, Left side facial swelling. Will cover for dental abscess. Rx: Amoxicillin F/u with  dentist as soon as possible to get tooth fixed.   Right knee pain- acute on chronic  Plain films c/w OA with joint effusion Knee sleeve applied for comfort. Pt to continue her pain medication as prescribed by pain management.  Resources for [employer] [facility] orth[facility] provided.     Noland Fordyce, PA-C 09/04/16 1250

## 2016-09-29 ENCOUNTER — Emergency Department (HOSPITAL_COMMUNITY)
Admission: EM | Admit: 2016-09-29 | Discharge: 2016-09-29 | Disposition: A | Discharge status: Home / Self Care | Payer: BLUE CROSS/BLUE SHIELD | Attending: Emergency Medicine | Admitting: Emergency Medicine

## 2016-09-29 ENCOUNTER — Encounter (HOSPITAL_COMMUNITY): Payer: Self-pay | Admitting: *Deleted

## 2016-09-29 ENCOUNTER — Emergency Department (HOSPITAL_COMMUNITY): Payer: BLUE CROSS/BLUE SHIELD

## 2016-09-29 DIAGNOSIS — I10 Essential (primary) hypertension: Secondary | ICD-10-CM | POA: Diagnosis not present

## 2016-09-29 DIAGNOSIS — Y929 Unspecified place or not applicable: Secondary | ICD-10-CM | POA: Insufficient documentation

## 2016-09-29 DIAGNOSIS — S1093XA Contusion of unspecified part of neck, initial encounter: Secondary | ICD-10-CM | POA: Diagnosis not present

## 2016-09-29 DIAGNOSIS — Y9301 Activity, walking, marching and hiking: Secondary | ICD-10-CM | POA: Insufficient documentation

## 2016-09-29 DIAGNOSIS — S300XXA Contusion of lower back and pelvis, initial encounter: Secondary | ICD-10-CM | POA: Diagnosis not present

## 2016-09-29 DIAGNOSIS — W19XXXA Unspecified fall, initial encounter: Secondary | ICD-10-CM

## 2016-09-29 DIAGNOSIS — Z87891 Personal history of nicotine dependence: Secondary | ICD-10-CM | POA: Insufficient documentation

## 2016-09-29 DIAGNOSIS — W109XXA Fall (on) (from) unspecified stairs and steps, initial encounter: Secondary | ICD-10-CM | POA: Diagnosis not present

## 2016-09-29 DIAGNOSIS — Y990 Civilian activity done for income or pay: Secondary | ICD-10-CM | POA: Insufficient documentation

## 2016-09-29 DIAGNOSIS — S7001XA Contusion of right hip, initial encounter: Secondary | ICD-10-CM | POA: Insufficient documentation

## 2016-09-29 DIAGNOSIS — S199XXA Unspecified injury of neck, initial encounter: Secondary | ICD-10-CM | POA: Diagnosis present

## 2016-09-29 DIAGNOSIS — T07XXXA Unspecified multiple injuries, initial encounter: Secondary | ICD-10-CM

## 2016-09-29 MED ORDER — MORPHINE SULFATE (PF) 4 MG/ML IV SOLN
4.0000 mg | Freq: Once | INTRAVENOUS | Status: AC
  Administered 2016-09-29: 4 mg via INTRAMUSCULAR
  Filled 2016-09-29: qty 1

## 2016-09-29 NOTE — ED Notes (Signed)
Patient given [place] ale and sandwich per EDP

## 2016-09-29 NOTE — ED Notes (Addendum)
Pt reports she was going down the [employer] bus' steps, tripped on a "lift metal piece on the steps."  [place] backwards, hitting the back of her head and low back.  States her R knee "gave out" on her.  She also reports that she has a pending R knee surgery next month.  Denies LOC.  Pt is A&Ox 4.  Pt also reports she is no longer a pain mgt pt

## 2016-09-29 NOTE — ED Notes (Signed)
Pt ambulated to the BR with a cane with a steady gait.

## 2016-09-29 NOTE — ED Provider Notes (Signed)
[facility] DEPT Provider Note   CSN: [id number] Arrival date & time: 09/29/16  0806     History   Chief Complaint Chief Complaint  Patient presents with  . Fall  . Neck Injury    HPI Donna Coleman is a 46 y.o. female.  The history is provided by the patient. No language interpreter was used.    Donna Coleman is a 46 y.o. female who presents to the Emergency Department complaining of fall, pain.  He was walking down 2 steps and slipped, sliding down and falling backwards. She has a history of chronic pain and she reports an extremely worsening in her chronic pain. She states she struck the back of her low back as well as twisted her right hip and knee. She endorses pain in her low back, right hip, right knee. She also endorses pain to the back of her neck that she felt after her head was tossed back. No known head injury, no loss of consciousness. She is seen at a pain center and gets epidural injections in her spine. Last procedure was about a week ago. She has chronic weakness and uses a walker or cane to ambulate. She states her weakness is at her baseline and she denies any new numbness. No chest pain, abdominal pain, shortness of breath. Right knee.  Low back.  Neck. Right hip.    Past Medical History:  Diagnosis Date  . Arthritis    neck, central lumbar area  . Cervical spine disease 10/2012  . Chronic pain syndrome   . Depression   . Dyslipidemia   . Fibroid tumor in breast  . Fibromyalgia   . Headache(784.0)   . Hypertension   . Memory deficits 08/11/2013  . Myalgia and myositis, unspecified 04/07/2013  . Neuropathy ([place])   . Obesity   . Osteoarthritis   . RP (retinitis pigmentosa)   . Vitamin D deficiency     Patient Active Problem List   Diagnosis Date Noted  . Loss of weight 12/24/2015  . Pain, dental 12/24/2015  . Neuropathy ([place]) 11/16/2015  . Fibromyalgia syndrome 11/16/2015  . Essential hypertension 06/15/2015  . Hyperlipidemia 06/15/2015  .  Chronic pain syndrome 06/15/2015  . Anxiety and depression 06/15/2015  . Memory deficits 08/11/2013  . Myalgia and myositis, unspecified 04/07/2013  . OSA (obstructive sleep apnea) 04/04/2013    Past Surgical History:  Procedure Laterality Date  . TUBAL LIGATION      OB History    No data available       Home Medications    Prior to Admission medications   Medication Sig Start Date End Date Taking? Authorizing Provider  ABILIFY 10 MG tablet Take 10 mg by mouth daily. Reported on 12/24/2015 01/15/15   Historical Provider, MD  ALPRAZolam Duanne Moron) 1 MG tablet 1 mg daily as needed. Reported on 12/24/2015 02/17/15   Historical Provider, MD  amoxicillin (AMOXIL) 500 MG capsule Take 1 capsule (500 mg total) by mouth 3 (three) times daily. 09/04/16   Noland Fordyce, PA-C  busPIRone (BUSPAR) 15 MG tablet 15 mg 3 (three) times daily.  01/08/15   Historical Provider, MD  butalbital-acetaminophen-caffeine (FIORICET) 50-325-40 MG per tablet Take 1 tablet by mouth 4 (four) times daily as needed for headache. Reported on 12/24/2015    Historical Provider, MD  carisoprodol (SOMA) 350 MG tablet Take 350 mg by mouth 3 (three) times daily as needed for muscle spasms. Reported on 12/24/2015    Historical Provider, MD  Cholecalciferol (RA  VITAMIN D-3) 1000 UNITS tablet Take 1,000 Units by mouth daily. Reported on 12/24/2015    Historical Provider, MD  clobetasol cream (TEMOVATE) 0.17 % Apply 1 application topically 2 (two) times daily. Reported on 12/24/2015    Historical Provider, MD  cycloSPORINE (RESTASIS) 0.05 % ophthalmic emulsion Place 1 drop into both eyes 2 (two) times daily as needed (for eye irritation). Reported on 12/24/2015    Historical Provider, MD  FLUoxetine HCl 60 MG TABS Take 1 tablet by mouth at bedtime. Take one tablet by mouth once daily at bedtime for axiety Patient not taking: Reported on 05/14/2016 06/15/15   Jule Ser, DO  gabapentin (NEURONTIN) 400 MG capsule Take 1 capsule (400 mg  total) by mouth 4 (four) times daily. 05/07/16   Micheline Chapman, NP  ibuprofen (ADVIL,MOTRIN) 800 MG tablet Take 1 tablet (800 mg total) by mouth every 8 (eight) hours as needed. Patient not taking: Reported on 05/14/2016 12/24/15   Dorena Dew, FNP  lidocaine (LIDODERM) 5 % Place 1 patch onto the skin daily. Remove & Discard patch within 12 hours or as directed by MD Patient not taking: Reported on 05/14/2016 05/07/14   Delos Haring, PA-C  lisinopril (PRINIVIL,ZESTRIL) 20 MG tablet Take 1 tablet (20 mg total) by mouth daily. 11/16/15   Dorena Dew, FNP  methocarbamol (ROBAXIN) 500 MG tablet Take 1 tablet (500 mg total) by mouth 2 (two) times daily as needed for muscle spasms. Patient not taking: Reported on 05/14/2016 07/17/15   Waynetta Pean, PA-C  oxyCODONE (ROXICODONE) 15 MG immediate release tablet Take 1 tablet (15 mg total) by mouth every 4 (four) hours as needed for pain. Patient taking differently: Take 20 mg by mouth every 4 (four) hours as needed for pain.  07/06/15   Konrad Felix, PA  traMADol (ULTRAM) 50 MG tablet Take 2 tablets (100 mg total) by mouth every 6 (six) hours as needed. Patient not taking: Reported on 05/14/2016 11/16/15   Dorena Dew, FNP  Vitamin D, Ergocalciferol, (DRISDOL) 50000 UNITS CAPS capsule Reported on 12/24/2015 01/08/15   Historical Provider, MD    Family History Family History  Problem Relation Age of Onset  . Diabetes Maternal Grandmother   . Alzheimer's disease Maternal Grandmother     Social History Social History  Substance Use Topics  . Smoking status: Former Smoker    Packs/day: 0.33    Years: 12.00    Types: Cigarettes    Quit date: 06/30/2010  . Smokeless tobacco: Never Used     Comment: 1pack per 3 days.   . Alcohol use No     Allergies   Patient has no known allergies.   Review of Systems Review of Systems  All other systems reviewed and are negative.    Physical Exam Updated Vital Signs BP (!) 160/112 (BP  Location: Left Arm)   Pulse 79   Temp 98.2 F (36.8 C) (Oral)   Resp 18   Ht 5' 2.5" (1.588 m)   Wt 164 lb (74.4 kg)   LMP 09/04/2016 (Exact Date) Comment: preg test waiver signed 09/29/16  SpO2 100%   BMI 29.52 kg/m   Physical Exam  Constitutional: She is oriented to person, place, and time. She appears well-developed and well-nourished.  HENT:  Head: Normocephalic and atraumatic.  Cardiovascular: Normal rate and regular rhythm.   No murmur heard. Pulmonary/Chest: Effort normal and breath sounds normal. No respiratory distress.  Abdominal: Soft. There is no tenderness. There is no rebound and  no guarding.  Musculoskeletal:  2+ DP pulses bilaterally. There is no midline cervical tenderness but she does have paraspinous tenderness on the neck. She has lower lumbar tenderness to palpation. There is tenderness over the right hip but range of motion is preserved. There is tenderness throughout the right knee and decreased range of motion secondary to pain.  Neurological: She is alert and oriented to person, place, and time.  4+ out of 5 strength in all 4 extremities. Patient states this is at her baseline strength. Sensation to light touch intact in all four extremities.    Skin: Skin is warm and dry.  Psychiatric: She has a normal mood and affect. Her behavior is normal.  Nursing note and vitals reviewed.    ED Treatments / Results  Labs (all labs ordered are listed, but only abnormal results are displayed) Labs Reviewed - No data to display  EKG  EKG Interpretation None       Radiology Dg Cervical Spine Complete  Result Date: 09/29/2016 CLINICAL DATA:  Pain following fall EXAM: CERVICAL SPINE - COMPLETE 4+ VIEW COMPARISON:  July 17, 2015 FINDINGS: Frontal, lateral, open-mouth odontoid, and bilateral oblique views were obtained. There is no fracture or spondylolisthesis. Prevertebral soft tissues and predental space regions are normal. There is moderate disc space narrowing  at C3-4, C4-5, C5-6, and C6-7. There are anterior osteophytes at C3, C4, C5, and C6. There is facet hypertrophy with exit foraminal narrowing at C4-5, C5-6, and C6-7 bilaterally, more severe on the left than on the right. Lung apices are clear. There is reversal of lordotic curvature. IMPRESSION: Multilevel osteoarthritic change. No fracture or spondylolisthesis. Reversal of lordotic curvature is likely indicative of chronic muscle spasm. Electronically Signed   By: Lowella Grip III M.D.   On: 09/29/2016 09:37   Dg Lumbar Spine Complete  Result Date: 09/29/2016 CLINICAL DATA:  Status post fall [place] steps of the city bus today. The patient reports low back pain. History of osteoarthritis. EXAM: LUMBAR SPINE - COMPLETE 4+ VIEW COMPARISON:  Lumbar spine series of July 17, 2015 FINDINGS: The twelfth ribs are small. There is mild lumbar dextrocurvature slightly more conspicuous today. The lumbar vertebral bodies are preserved in height. The pedicles and transverse processes are intact. The observed portions of the sacrum are normal. There is disc space narrowing at L4-5 and to a lesser extent at other levels which is stable. There is no spondylolisthesis. There is no significant facet joint hypertrophy. There is an anterior near bridging osteophyte at T11-T12. There is calcification in the wall of the abdominal aorta and common iliac vessels. IMPRESSION: Moderate L4-5 disc space narrowing, stable. Milder disc space narrowing at other levels, stable. No compression fracture, spondylolisthesis, nor other acute bony abnormality. Abdominal aortic atherosclerosis. Electronically Signed   By: David  Martinique M.D.   On: 09/29/2016 09:36   Dg Knee Complete 4 Views Right  Result Date: 09/29/2016 CLINICAL DATA:  [place] down stairs with right hip, and right knee pain, some low back pain EXAM: RIGHT KNEE - COMPLETE 4+ VIEW COMPARISON:  None. FINDINGS: There is slightly age advanced tricompartmental degenerative joint  disease present primarily involving the medial compartment where there is slightly more loss of joint space and spurring present. No acute fracture is seen. There does appear to be a right knee joint effusion present. IMPRESSION: 1. Slightly age advanced tricompartmental degenerative joint disease of the right knee. 2. Small right knee joint effusion. Electronically Signed   By: Windy Canny.D.  On: 09/29/2016 09:37   Dg Hip Unilat W Or Wo Pelvis 2-3 Views Right  Result Date: 09/29/2016 CLINICAL DATA:  Pain following fall EXAM: DG HIP (WITH OR WITHOUT PELVIS) 2-3V RIGHT COMPARISON:  None. FINDINGS: Frontal pelvis as well as frontal and lateral right hip images were obtained. There is no fracture or dislocation. The joint spaces appear normal. No erosive change. IMPRESSION: No fracture or dislocation.  No evident arthropathy. Electronically Signed   By: Lowella Grip III M.D.   On: 09/29/2016 09:37    Procedures Procedures (including critical care time)  Medications Ordered in ED Medications  morphine 4 MG/ML injection 4 mg (4 mg Intramuscular Given 09/29/16 0942)     Initial Impression / Assessment and Plan / ED Course  I have reviewed the triage vital signs and the nursing notes.  Pertinent labs & imaging results that were available during my care of the patient were reviewed by me and considered in my medical decision making (see chart for details).     Patient with history of fibromyalgia, arthritis, chronic pain here for evaluation of injuries following a fall. She has acute on chronic pain in multiple locations. She is at her neurologic baseline in the emergency department with no evidence of acute fracture or dislocation. Counseled patient on home care for contusions following a fall with rest, ice/heat, over-the-counter analgesics, PCP follow-up and return precautions.  Final Clinical Impressions(s) / ED Diagnoses   Final diagnoses:  Fall, initial encounter  Multiple contusions     New Prescriptions New Prescriptions   No medications on file     Quintella Reichert, MD 09/29/16 [phone]

## 2016-09-29 NOTE — ED Notes (Signed)
Pt contacted [facility] bus for transport

## 2016-09-29 NOTE — ED Notes (Signed)
Pt is aware that her BP is elevated, has hx of HTN and is supposed to take lisinopril.  She has not taken her med for over a year.

## 2016-09-29 NOTE — ED Triage Notes (Signed)
Per EMS, pt slipped and fell while at work this am, going down plastic stairs.  [place] [address] backwards hitting the back of her head.  Pt c/o neck pain that radiates to bila shoulders, low back pain and knee pain.  Pt is a pt at the pain clinic for chronic pain syndrome.  Denies LOC

## 2016-10-15 ENCOUNTER — Emergency Department (HOSPITAL_COMMUNITY)
Admission: EM | Admit: 2016-10-15 | Discharge: 2016-10-15 | Discharge status: Against Medical Advice | Payer: BLUE CROSS/BLUE SHIELD | Attending: Emergency Medicine | Admitting: Emergency Medicine

## 2016-10-15 ENCOUNTER — Encounter (HOSPITAL_COMMUNITY): Payer: Self-pay | Admitting: Emergency Medicine

## 2016-10-15 ENCOUNTER — Emergency Department (HOSPITAL_COMMUNITY): Payer: BLUE CROSS/BLUE SHIELD

## 2016-10-15 DIAGNOSIS — Y939 Activity, unspecified: Secondary | ICD-10-CM | POA: Insufficient documentation

## 2016-10-15 DIAGNOSIS — Z87891 Personal history of nicotine dependence: Secondary | ICD-10-CM | POA: Diagnosis not present

## 2016-10-15 DIAGNOSIS — Y999 Unspecified external cause status: Secondary | ICD-10-CM | POA: Diagnosis not present

## 2016-10-15 DIAGNOSIS — I10 Essential (primary) hypertension: Secondary | ICD-10-CM | POA: Insufficient documentation

## 2016-10-15 DIAGNOSIS — M545 Low back pain, unspecified: Secondary | ICD-10-CM

## 2016-10-15 DIAGNOSIS — G8929 Other chronic pain: Secondary | ICD-10-CM | POA: Diagnosis not present

## 2016-10-15 DIAGNOSIS — Y92410 Unspecified street and highway as the place of occurrence of the external cause: Secondary | ICD-10-CM | POA: Insufficient documentation

## 2016-10-15 MED ORDER — MORPHINE SULFATE (PF) 4 MG/ML IV SOLN
4.0000 mg | Freq: Once | INTRAVENOUS | Status: DC
  Filled 2016-10-15: qty 1

## 2016-10-15 MED ORDER — HYDROMORPHONE HCL 2 MG/ML IJ SOLN
1.0000 mg | Freq: Once | INTRAMUSCULAR | Status: AC
  Administered 2016-10-15: 1 mg via INTRAMUSCULAR
  Filled 2016-10-15: qty 1

## 2016-10-15 NOTE — ED Provider Notes (Signed)
[facility] DEPT Provider Note   CSN: [id number] Arrival date & time: 10/15/16  1431   By signing my name below, I, Eunice Blase, attest that this documentation has been prepared under the direction and in the presence of Eliezer Mccoy, PA-C. Electronically Signed: Eunice Blase, Scribe. 10/15/16. 4:22 PM.   History   Chief Complaint Chief Complaint  Patient presents with  . Back Pain  . Neck Pain   The history is provided by the patient and medical records. No language interpreter was used.    Donna Coleman is a 46 y.o. female with h/o degenerative disc disease, bursitis, arthritis, tail bone fracture, HTN, neuropathy, chronic low back pain, fibromyalgia and cervical spine disease, transported by public transportation to the Emergency Department with concern for worsened low pain s/p falling on the bus ~2 weeks ago. Pt reportedly seen in [facility] ED for evaluation following the fall initially on 09/29/2016. Imaging done and a morphine shot administered with adequate relief with at home pain medication until the past few days. She notes associated bilateral shoulder painWhich is chronic. Pt describes 9/10, hot, stabbing low back pain that is worsened with movement. Oxycodone and soma does not help with this she states. No other modifying factors noted. She notes an upcoming evaluation with her orthopedic specialist "next week". No numbness, saddle anesthesia, upper extremity, neck pain or any other complaints at this time.    Past Medical History:  Diagnosis Date  . Arthritis    neck, central lumbar area  . Cervical spine disease 10/2012  . Chronic pain syndrome   . Depression   . Dyslipidemia   . Fibroid tumor in breast  . Fibromyalgia   . Headache(784.0)   . Hypertension   . Memory deficits 08/11/2013  . Myalgia and myositis, unspecified 04/07/2013  . Neuropathy   . Obesity   . Osteoarthritis   . RP (retinitis pigmentosa)   . Vitamin D deficiency     Patient Active Problem  List   Diagnosis Date Noted  . Loss of weight 12/24/2015  . Pain, dental 12/24/2015  . Neuropathy 11/16/2015  . Fibromyalgia syndrome 11/16/2015  . Essential hypertension 06/15/2015  . Hyperlipidemia 06/15/2015  . Chronic pain syndrome 06/15/2015  . Anxiety and depression 06/15/2015  . Memory deficits 08/11/2013  . Myalgia and myositis, unspecified 04/07/2013  . OSA (obstructive sleep apnea) 04/04/2013    Past Surgical History:  Procedure Laterality Date  . TUBAL LIGATION      OB History    No data available       Home Medications    Prior to Admission medications   Medication Sig Start Date End Date Taking? Authorizing Provider  ABILIFY 10 MG tablet Take 10 mg by mouth daily. Reported on 12/24/2015 01/15/15   Historical Provider, MD  ALPRAZolam Duanne Moron) 1 MG tablet 1 mg daily as needed. Reported on 12/24/2015 02/17/15   Historical Provider, MD  amoxicillin (AMOXIL) 500 MG capsule Take 1 capsule (500 mg total) by mouth 3 (three) times daily. 09/04/16   Noland Fordyce, PA-C  busPIRone (BUSPAR) 15 MG tablet 15 mg 3 (three) times daily.  01/08/15   Historical Provider, MD  butalbital-acetaminophen-caffeine (FIORICET) 50-325-40 MG per tablet Take 1 tablet by mouth 4 (four) times daily as needed for headache. Reported on 12/24/2015    Historical Provider, MD  carisoprodol (SOMA) 350 MG tablet Take 350 mg by mouth 3 (three) times daily as needed for muscle spasms. Reported on 12/24/2015    Historical Provider, MD  Cholecalciferol (RA VITAMIN D-3) 1000 UNITS tablet Take 1,000 Units by mouth daily. Reported on 12/24/2015    Historical Provider, MD  clobetasol cream (TEMOVATE) 4.19 % Apply 1 application topically 2 (two) times daily. Reported on 12/24/2015    Historical Provider, MD  cycloSPORINE (RESTASIS) 0.05 % ophthalmic emulsion Place 1 drop into both eyes 2 (two) times daily as needed (for eye irritation). Reported on 12/24/2015    Historical Provider, MD  FLUoxetine HCl 60 MG TABS Take 1  tablet by mouth at bedtime. Take one tablet by mouth once daily at bedtime for axiety Patient not taking: Reported on 05/14/2016 06/15/15   Jule Ser, DO  gabapentin (NEURONTIN) 400 MG capsule Take 1 capsule (400 mg total) by mouth 4 (four) times daily. 05/07/16   Micheline Chapman, NP  ibuprofen (ADVIL,MOTRIN) 800 MG tablet Take 1 tablet (800 mg total) by mouth every 8 (eight) hours as needed. Patient not taking: Reported on 05/14/2016 12/24/15   Dorena Dew, FNP  lidocaine (LIDODERM) 5 % Place 1 patch onto the skin daily. Remove & Discard patch within 12 hours or as directed by MD Patient not taking: Reported on 05/14/2016 05/07/14   Delos Haring, PA-C  lisinopril (PRINIVIL,ZESTRIL) 20 MG tablet Take 1 tablet (20 mg total) by mouth daily. 11/16/15   Dorena Dew, FNP  methocarbamol (ROBAXIN) 500 MG tablet Take 1 tablet (500 mg total) by mouth 2 (two) times daily as needed for muscle spasms. Patient not taking: Reported on 05/14/2016 07/17/15   Waynetta Pean, PA-C  oxyCODONE (ROXICODONE) 15 MG immediate release tablet Take 1 tablet (15 mg total) by mouth every 4 (four) hours as needed for pain. Patient taking differently: Take 20 mg by mouth every 4 (four) hours as needed for pain.  07/06/15   Konrad Felix, PA  traMADol (ULTRAM) 50 MG tablet Take 2 tablets (100 mg total) by mouth every 6 (six) hours as needed. Patient not taking: Reported on 05/14/2016 11/16/15   Dorena Dew, FNP  Vitamin D, Ergocalciferol, (DRISDOL) 50000 UNITS CAPS capsule Reported on 12/24/2015 01/08/15   Historical Provider, MD    Family History Family History  Problem Relation Age of Onset  . Diabetes Maternal Grandmother   . Alzheimer's disease Maternal Grandmother     Social History Social History  Substance Use Topics  . Smoking status: Former Smoker    Packs/day: 0.33    Years: 12.00    Types: Cigarettes    Quit date: 06/30/2010  . Smokeless tobacco: Never Used     Comment: 1pack per 3 days.     . Alcohol use No     Allergies   Patient has no known allergies.   Review of Systems Review of Systems  Genitourinary: Negative for difficulty urinating (no incontinence).  Musculoskeletal: Positive for arthralgias, back pain and myalgias. Negative for neck pain.  Neurological: Negative for weakness and numbness.     Physical Exam Updated Vital Signs BP (!) 164/86 (BP Location: Right Arm)   Pulse 92   Temp 98.5 F (36.9 C) (Oral)   Resp 18   SpO2 98%   Physical Exam  Constitutional: She appears well-developed and well-nourished. No distress.  HENT:  Head: Normocephalic and atraumatic.  Mouth/Throat: Oropharynx is clear and moist. No oropharyngeal exudate.  Eyes: Conjunctivae are normal. Pupils are equal, round, and reactive to light. Right eye exhibits no discharge. Left eye exhibits no discharge. No scleral icterus.  Neck: Normal range of motion. Neck supple. No thyromegaly  present.  Cardiovascular: Normal rate, regular rhythm, normal heart sounds and intact distal pulses.  Exam reveals no gallop and no friction rub.   No murmur heard. Pulmonary/Chest: Effort normal and breath sounds normal. No stridor. No respiratory distress. She has no wheezes. She has no rales.  Abdominal: Soft. Bowel sounds are normal. She exhibits no distension. There is no tenderness. There is no rebound and no guarding.  Musculoskeletal: She exhibits no edema.       Thoracic back: She exhibits tenderness and bony tenderness.       Lumbar back: She exhibits tenderness and bony tenderness.  Lymphadenopathy:    She has no cervical adenopathy.  Neurological: She is alert. Coordination normal.  Reflex Scores:      Patellar reflexes are 2+ on the right side and 2+ on the left side. Normal sensation and 5/5 strength to lower extremities  Skin: Skin is warm and dry. No rash noted. She is not diaphoretic. No pallor.  Psychiatric: She has a normal mood and affect.  Nursing note and vitals  reviewed.    ED Treatments / Results  DIAGNOSTIC STUDIES: Oxygen Saturation is 98% on RA, NL by my interpretation.    COORDINATION OF CARE: 4:07 PM-Discussed next steps with pt. Pt verbalized understanding and is agreeable with the plan. Will order medications and imaging. 4:41 PM Pt left AMA before receiving imaging. Nursing staff states pt left because "she had another appointment to go to."  Labs (all labs ordered are listed, but only abnormal results are displayed) Labs Reviewed - No data to display  EKG  EKG Interpretation None       Radiology No results found.  Procedures Procedures (including critical care time)  Medications Ordered in ED Medications  HYDROmorphone (DILAUDID) injection 1 mg (1 mg Intramuscular Given 10/15/16 1612)     Initial Impression / Assessment and Plan / ED Course  I have reviewed the triage vital signs and the nursing notes.  Pertinent labs & imaging results that were available during my care of the patient were reviewed by me and considered in my medical decision making (see chart for details).     Patient with back pain.  Considering imaging of thoracic spine was not done after fall, I ordered a thoracic x-ray. However, patient left the ED prior to imaging, after IM pain injection was administered. No neurological deficits and normal neuro exam.  Patient is ambulatory.  No loss of bowel or bladder control.  No concern for cauda equina.  No fever, night sweats, weight loss, h/o cancer, IVDA, no recent procedure to back. No urinary symptoms suggestive of UTI.  Patient has follow-up with orthopedics and is followed by pain clinic. Patient eloped the ED because she had another appointment to go to according into nursing staff.  Final Clinical Impressions(s) / ED Diagnoses   Final diagnoses:  Chronic midline low back pain without sciatica    New Prescriptions Discharge Medication List as of 10/15/2016  5:27 PM    I personally performed the  services described in this documentation, which was scribed in my presence. The recorded information has been reviewed and is accurate.    Frederica Kuster, PA-C 10/19/16 1204    Fatima Blank, MD 10/20/16 1556

## 2016-10-15 NOTE — Discharge Instructions (Signed)
Please follow-up with your orthopedic doctor and pain management clinic for further evaluation and treatment of your pain. Please return to emergency department if you develop any new or worsening symptoms, including inability to walk, loss of bowel or bladder control, numbness in your groin, or any other new or concerning symptoms.

## 2016-10-15 NOTE — ED Notes (Signed)
Pt stated that she had another appt to get to and she could not be late while waiting on d/c paperwork. Ambulatory and stable-appearing upon d/c. Pt was instructed not to drive on narcotics.

## 2016-10-15 NOTE — ED Triage Notes (Signed)
Patient states that week ago she fell off [employer] bus and was seen here for pain. patient states that she is still having neck and back pain.

## 2016-10-30 ENCOUNTER — Encounter (INDEPENDENT_AMBULATORY_CARE_PROVIDER_SITE_OTHER): Payer: Self-pay | Admitting: Orthopaedic Surgery

## 2016-10-30 ENCOUNTER — Ambulatory Visit (INDEPENDENT_AMBULATORY_CARE_PROVIDER_SITE_OTHER): Payer: BLUE CROSS/BLUE SHIELD | Admitting: Orthopaedic Surgery

## 2016-10-30 DIAGNOSIS — G8929 Other chronic pain: Secondary | ICD-10-CM | POA: Insufficient documentation

## 2016-10-30 DIAGNOSIS — M25551 Pain in right hip: Secondary | ICD-10-CM | POA: Diagnosis not present

## 2016-10-30 DIAGNOSIS — M25562 Pain in left knee: Secondary | ICD-10-CM | POA: Diagnosis not present

## 2016-10-30 MED ORDER — BUPIVACAINE HCL 0.5 % IJ SOLN
2.0000 mL | INTRAMUSCULAR | Status: AC | PRN
  Administered 2016-10-30: 2 mL via INTRA_ARTICULAR

## 2016-10-30 MED ORDER — BUPIVACAINE HCL 0.5 % IJ SOLN
3.0000 mL | INTRAMUSCULAR | Status: AC | PRN
  Administered 2016-10-30: 3 mL via INTRA_ARTICULAR

## 2016-10-30 MED ORDER — LIDOCAINE HCL 1 % IJ SOLN
2.0000 mL | INTRAMUSCULAR | Status: AC | PRN
  Administered 2016-10-30: 2 mL

## 2016-10-30 MED ORDER — LIDOCAINE HCL 1 % IJ SOLN
3.0000 mL | INTRAMUSCULAR | Status: AC | PRN
  Administered 2016-10-30: 3 mL

## 2016-10-30 MED ORDER — METHYLPREDNISOLONE ACETATE 40 MG/ML IJ SUSP
40.0000 mg | INTRAMUSCULAR | Status: AC | PRN
  Administered 2016-10-30: 40 mg via INTRA_ARTICULAR

## 2016-10-30 NOTE — Progress Notes (Signed)
Office Visit Note   Patient: Donna Coleman           Date of Birth: [birth year]           MRN: [id number] Visit Date: 10/30/2016              Requested by: No referring provider defined for this encounter. PCP: Pcp Not In System   Assessment & Plan: Visit Diagnoses:  1. Pain of right hip joint   2. Chronic pain of left knee     Plan: Left knee and right trochanter bursitis injection was performed today. Follow-up with me as needed.  Follow-Up Instructions: Return if symptoms worsen or fail to improve.   Orders:  No orders of the defined types were placed in this encounter.  No orders of the defined types were placed in this encounter.     Procedures: Large Joint Inj Date/Time: 10/30/2016 8:15 PM Performed by: Leandrew Koyanagi Authorized by: Leandrew Koyanagi   Consent Given by:  Patient Timeout: prior to procedure the correct patient, procedure, and site was verified   Indications:  Pain Location:  Knee Site:  R knee Prep: patient was prepped and draped in usual sterile fashion   Needle Size:  22 G Ultrasound Guidance: No   Fluoroscopic Guidance: No   Arthrogram: No   Medications:  2 mL lidocaine 1 %; 2 mL bupivacaine 0.5 %; 40 mg methylPREDNISolone acetate 40 MG/ML Patient tolerance:  Patient tolerated the procedure well with no immediate complications Large Joint Inj Date/Time: 10/30/2016 8:16 PM Performed by: Leandrew Koyanagi Authorized by: Leandrew Koyanagi   Consent Given by:  Patient Timeout: prior to procedure the correct patient, procedure, and site was verified   Indications:  Pain Location:  Hip Site:  R greater trochanter Prep: patient was prepped and draped in usual sterile fashion   Needle Size:  22 G Approach:  Lateral Ultrasound Guidance: No   Fluoroscopic Guidance: No   Arthrogram: No   Medications:  3 mL lidocaine 1 %; 3 mL bupivacaine 0.5 %; 40 mg methylPREDNISolone acetate 40 MG/ML     Clinical Data: No additional  findings.   Subjective: Chief Complaint  Patient presents with  . Lower Back - Pain  . Right Knee - Pain  . Left Knee - Pain  . Left Hip - Pain  . Right Hip - Pain    Donna Coleman comes back today for left knee and right hip pain. She is requesting injections in both. Denies any new symptoms.    Review of Systems   Objective: Vital Signs: There were no vitals taken for this visit.  Physical Exam  Ortho Exam Left knee and right hip exams are stable. Specialty Comments:  No specialty comments available.  Imaging: No results found.   PMFS History: Patient Active Problem List   Diagnosis Date Noted  . Pain of right hip joint 10/30/2016  . Chronic pain of left knee 10/30/2016  . Loss of weight 12/24/2015  . Pain, dental 12/24/2015  . Neuropathy 11/16/2015  . Fibromyalgia syndrome 11/16/2015  . Essential hypertension 06/15/2015  . Hyperlipidemia 06/15/2015  . Chronic pain syndrome 06/15/2015  . Anxiety and depression 06/15/2015  . Memory deficits 08/11/2013  . Myalgia and myositis, unspecified 04/07/2013  . OSA (obstructive sleep apnea) 04/04/2013   Past Medical History:  Diagnosis Date  . Arthritis    neck, central lumbar area  . Cervical spine disease 10/2012  . Chronic pain syndrome   .  Depression   . Dyslipidemia   . Fibroid tumor in breast  . Fibromyalgia   . Headache(784.0)   . Hypertension   . Memory deficits 08/11/2013  . Myalgia and myositis, unspecified 04/07/2013  . Neuropathy   . Obesity   . Osteoarthritis   . RP (retinitis pigmentosa)   . Vitamin D deficiency     Family History  Problem Relation Age of Onset  . Diabetes Maternal Grandmother   . Alzheimer's disease Maternal Grandmother     Past Surgical History:  Procedure Laterality Date  . TUBAL LIGATION     Social History   Occupational History  . disabled     since 2--Nurse   Social History Main Topics  . Smoking status: Former Smoker    Packs/day: 0.33    Years: 12.00     Types: Cigarettes    Quit date: 06/30/2010  . Smokeless tobacco: Never Used     Comment: 1pack per 3 days.   . Alcohol use No  . Drug use: No  . Sexual activity: Not on file

## 2016-11-11 ENCOUNTER — Ambulatory Visit: Payer: Self-pay | Admitting: Family Medicine

## 2016-12-15 ENCOUNTER — Ambulatory Visit (INDEPENDENT_AMBULATORY_CARE_PROVIDER_SITE_OTHER): Payer: BLUE CROSS/BLUE SHIELD | Admitting: Orthopaedic Surgery

## 2016-12-22 ENCOUNTER — Ambulatory Visit (INDEPENDENT_AMBULATORY_CARE_PROVIDER_SITE_OTHER): Payer: BLUE CROSS/BLUE SHIELD | Admitting: Family Medicine

## 2016-12-22 ENCOUNTER — Ambulatory Visit (INDEPENDENT_AMBULATORY_CARE_PROVIDER_SITE_OTHER): Payer: BLUE CROSS/BLUE SHIELD | Admitting: Orthopaedic Surgery

## 2016-12-22 ENCOUNTER — Encounter: Payer: Self-pay | Admitting: Family Medicine

## 2016-12-22 VITALS — BP 148/82 | HR 72 | Temp 98.1°F | Resp 14 | Ht 62.5 in | Wt 172.0 lb

## 2016-12-22 DIAGNOSIS — G8929 Other chronic pain: Secondary | ICD-10-CM

## 2016-12-22 DIAGNOSIS — B351 Tinea unguium: Secondary | ICD-10-CM

## 2016-12-22 DIAGNOSIS — M25552 Pain in left hip: Secondary | ICD-10-CM

## 2016-12-22 DIAGNOSIS — I10 Essential (primary) hypertension: Secondary | ICD-10-CM

## 2016-12-22 DIAGNOSIS — F329 Major depressive disorder, single episode, unspecified: Secondary | ICD-10-CM | POA: Diagnosis not present

## 2016-12-22 DIAGNOSIS — M25562 Pain in left knee: Secondary | ICD-10-CM

## 2016-12-22 DIAGNOSIS — G894 Chronic pain syndrome: Secondary | ICD-10-CM | POA: Diagnosis not present

## 2016-12-22 DIAGNOSIS — F419 Anxiety disorder, unspecified: Secondary | ICD-10-CM

## 2016-12-22 DIAGNOSIS — R634 Abnormal weight loss: Secondary | ICD-10-CM

## 2016-12-22 DIAGNOSIS — R5383 Other fatigue: Secondary | ICD-10-CM | POA: Diagnosis not present

## 2016-12-22 DIAGNOSIS — H539 Unspecified visual disturbance: Secondary | ICD-10-CM

## 2016-12-22 LAB — CBC WITH DIFFERENTIAL/PLATELET
BASOS ABS: 0 {cells}/uL (ref 0–200)
Basophils Relative: 0 %
EOS PCT: 1 %
Eosinophils Absolute: 76 cells/uL (ref 15–500)
HEMATOCRIT: 44.5 % (ref 35.0–45.0)
HEMOGLOBIN: 14.8 g/dL (ref 11.7–15.5)
LYMPHS ABS: 2356 {cells}/uL (ref 850–3900)
Lymphocytes Relative: 31 %
MCH: 33.4 pg — AB (ref 27.0–33.0)
MCHC: 33.3 g/dL (ref 32.0–36.0)
MCV: 100.5 fL — ABNORMAL HIGH (ref 80.0–100.0)
MPV: 11.1 fL (ref 7.5–12.5)
Monocytes Absolute: 380 cells/uL (ref 200–950)
Monocytes Relative: 5 %
NEUTROS ABS: 4788 {cells}/uL (ref 1500–7800)
Neutrophils Relative %: 63 %
Platelets: 291 10*3/uL (ref 140–400)
RBC: 4.43 MIL/uL (ref 3.80–5.10)
RDW: 14.9 % (ref 11.0–15.0)
WBC: 7.6 10*3/uL (ref 3.8–10.8)

## 2016-12-22 LAB — COMPLETE METABOLIC PANEL WITH GFR
ALT: 9 U/L (ref 6–29)
AST: 11 U/L (ref 10–35)
Albumin: 4.2 g/dL (ref 3.6–5.1)
Alkaline Phosphatase: 75 U/L (ref 33–115)
BILIRUBIN TOTAL: 0.3 mg/dL (ref 0.2–1.2)
BUN: 8 mg/dL (ref 7–25)
CO2: 26 mmol/L (ref 20–31)
CREATININE: 0.79 mg/dL (ref 0.50–1.10)
Calcium: 9.7 mg/dL (ref 8.6–10.2)
Chloride: 106 mmol/L (ref 98–110)
GFR, Est Non African American: >89 mL/min (ref >=60)
GLUCOSE: 60 mg/dL — AB (ref 65–99)
Potassium: 4.1 mmol/L (ref 3.5–5.3)
SODIUM: 141 mmol/L (ref 135–146)
TOTAL PROTEIN: 7 g/dL (ref 6.1–8.1)

## 2016-12-22 LAB — POCT URINALYSIS DIP (DEVICE)
Bilirubin Urine: NEGATIVE
Glucose, UA: NEGATIVE mg/dL
KETONES UR: NEGATIVE mg/dL
Leukocytes, UA: NEGATIVE
Nitrite: NEGATIVE
PH: 5.5 (ref 5.0–8.0)
PROTEIN: NEGATIVE mg/dL
UROBILINOGEN UA: 0.2 mg/dL (ref 0.0–1.0)

## 2016-12-22 MED ORDER — LOSARTAN POTASSIUM 50 MG PO TABS
50.0000 mg | ORAL_TABLET | Freq: Every day | ORAL | 1 refills | Status: AC
Start: 2016-12-22 — End: ?

## 2016-12-22 NOTE — Progress Notes (Signed)
Subjective:    Patient ID: Donna Coleman, female    DOB: [birth year], 46 y.o.   MRN: [id number]  HPI Ms. Donna Coleman, a 46 year old female with a history of arthritis, neuropathy, fibromyalgis hypertension, and chronic pain syndrome 46 y.o.  Ms. Donna Coleman has been lost to follow up over the past year due to insurance constraints. She says that she has recently gotten insurance benefits.  Ms. Donna Coleman has a history of hypertension. She was taking Lisinopril previously. She has been out of medication over the past several months. She has increase walking and has been following a strict diet.  She does not check blood pressures at home.Patient denies chest pain, dyspnea, fatigue, orthopnea, palpitations, syncope and tachypnea.  Cardiovascular risk factors include  dyslipidemia, obesity (BMI >= 30 kg/m2) and sedentary lifestyle.   Patient also has a history of fibromyalgia. Patient has a several year history of fibromyalgia and chronic pain syndrome.  She is followed by orthopedic specialist, Dr. Ronnie Doss and Dr. Mirna Mires for pain management. The symptoms are of severe and generalized severity. They are made worse by: cold exposure, kneeling, lying down, movement, overuse, sitting, standing and walking. She continues to complain of 8/10 pain despite taking Oxycodone 4 times per day. The patient denies fever, nausea, vomiting, or diarrhea. . Previous treatments include OTC meds and steroid injection. Associated symptoms include depression, fatigue and muscle weakness. Patient denies associated fevers, pleurisy and polydypsia.   She describes symptoms of neuropathy. The onset of symptoms was several years ago.  Symptoms are currently of moderate and generalized severity.   Past Medical History:  Diagnosis Date  . Arthritis    neck, central lumbar area  . Cervical spine disease 10/2012  . Chronic pain syndrome   . Depression   . Dyslipidemia   . Fibroid tumor in breast  . Fibromyalgia    . Headache(784.0)   . Hypertension   . Memory deficits 08/11/2013  . Myalgia and myositis, unspecified 04/07/2013  . Neuropathy   . Obesity   . Osteoarthritis   . RP (retinitis pigmentosa)   . Vitamin D deficiency    Social History   Social History  . Marital status: Single    Spouse name: N/A  . Number of children: 2  . Years of education: college   Occupational History  . 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 Smoking status: Former Smoker    Packs/day: 0.33    Years: 12.00    Types: Cigarettes    Quit date: 06/30/2010  . Smokeless tobacco: Never Used     Comment: 1pack per 3 days.   . Alcohol use No  . Drug use: No  . Sexual activity: Not on file   Other Topics Concern  . Not on file   Social History Narrative  . No narrative on file  No Known Allergies  Immunization History  Administered Date(s) Administered  . Tdap 05/14/2016   Review of Systems  Constitutional: Positive for fatigue.  Respiratory: Negative.  Negative for shortness of breath and wheezing.   Cardiovascular: Negative.  Negative for chest pain, palpitations and leg swelling.  Gastrointestinal: Negative.   Endocrine: Negative.  Negative for polydipsia, polyphagia and polyuria.  Genitourinary: Negative.   Musculoskeletal: Positive for arthralgias, back pain, joint swelling, myalgias and neck stiffness.  Skin: Negative.        Nail fungus  Allergic/Immunologic: Negative.   Neurological: Negative.  Hematological: Negative.   Psychiatric/Behavioral: Positive for agitation. Negative for sleep disturbance and suicidal ideas. The patient is nervous/anxious.        Objective:   Physical Exam  Constitutional: She is oriented to person, place, and time. Vital signs are normal. She appears well-developed and well-nourished.  HENT:  Head: Normocephalic and atraumatic.  Right Ear: External ear normal.  Nose: Nose normal.  Mouth/Throat: Oropharynx is clear and moist.   Eyes: Conjunctivae and EOM are normal. Pupils are equal, round, and reactive to light.  Neck: Normal range of motion. Neck supple.  Cardiovascular: Normal rate, regular rhythm, normal heart sounds and intact distal pulses.   Pulmonary/Chest: Effort normal and breath sounds normal.  Abdominal: Soft. Bowel sounds are normal.  Musculoskeletal:       Right knee: She exhibits decreased range of motion.       Left knee: She exhibits decreased range of motion.       Cervical back: She exhibits decreased range of motion, tenderness and pain. She exhibits no swelling.       Lumbar back: She exhibits decreased range of motion, tenderness and pain.  Patient ambulating with a cane.   Neurological: She is alert and oriented to person, place, and time. She has normal reflexes.  Skin: Skin is warm and dry.  Psychiatric: She has a normal mood and affect. Her behavior is normal. Judgment and thought content normal.      BP (!) 148/82 (BP Location: Right Arm, Patient Position: Sitting, Cuff Size: Normal) Comment: manually  Pulse 72   Temp 98.1 F (36.7 C) (Oral)   Resp 14   Ht 5' 2.5" (1.588 m)   Wt 172 lb (78 kg)   LMP 12/20/2016   SpO2 100%   BMI 30.96 kg/m  Assessment & Plan:  1. Essential hypertension Will start Losartan 50 mg for blood pressure. She says that she has been on hydrochlorothiazide in the past and was unable to tolerate Amlodipine.  - losartan (COZAAR) 50 MG tablet; Take 1 tablet (50 mg total) by mouth daily.  Dispense: 30 tablet; Refill: 1 - POCT urinalysis dip (device)  2. Anxiety and depression Patient has increased anxiety and became very upset when discussing a work situation. Discussed that she would benefit from psychiatry. She needs tools to deal with work and home situations.  I will not prescribe xanax today as requested.  GAD 7 : Generalized Anxiety Score 11/16/2015  Nervous, Anxious, on Edge 3  Control/stop worrying 2  Worry too much - different things 2  Trouble  relaxing 3  Restless 2  Easily annoyed or irritable 2  Afraid - awful might happen 2  Total GAD 7 Score 16  Anxiety Difficulty Somewhat difficult     - Ambulatory referral to Psychiatry  3. Chronic pain syndrome Continue to follow up with pain management.   - morphine (MSIR) 15 MG tablet; Take 15 mg by mouth 2 (two) times daily.  4. Weight loss TSH WNL - CBC with Differential  5. Other fatigue - CBC with Differential  6. Onychomycosis - Ambulatory referral to Podiatry  7. Chronic hip pain, left Continue to follow up with orthopedic specialist.  Will send labs to r/o autoimmune component of chronic pain.  - morphine (MSIR) 15 MG tablet; Take 15 mg by mouth 2 (two) times daily. - ANA - Rheumatoid factor - C-reactive protein - Sedimentation Rate  8. Chronic pain of left knee Continue to follow up with orthopedic specialist.  - morphine (MSIR)  15 MG tablet; Take 15 mg by mouth 2 (two) times daily. - ANA - Rheumatoid factor - C-reactive protein - Sedimentation Rate  9. Visual disturbance - Ambulatory referral to Ophthalmology   RTC: 1 month for hypertension   Donia Pounds  MSN, FNP-C [facility] [address] [place], [place] [address] [phone]

## 2016-12-22 NOTE — Patient Instructions (Addendum)
Hypertension:  Starting a trial of Losartan 50 mg daily. Blood pressure goal is <140/90.   Chronic pain syndrome:  Continue relationship with pain management. Dr. Mirna Mires.   Right knee and left hip pain.  Follow up with orthopedic specialists. Will send labs to rule out autoimmune component of chronic pain  Fatigue:  Will check CBC.   Onychomycosis:  Will send referral to podiatry  Visual disturbance:   Sent referral to opthalmology  Anxiety and depression:   Sent referral to psychiatry

## 2016-12-23 LAB — C-REACTIVE PROTEIN: CRP: 2.9 mg/L (ref <8.0)

## 2016-12-23 LAB — RHEUMATOID FACTOR: Rhuematoid fact SerPl-aCnc: <14 IU/mL (ref <14)

## 2016-12-23 LAB — ANA: ANA: NEGATIVE

## 2016-12-23 LAB — SEDIMENTATION RATE: Sed Rate: 4 mm/hr (ref 0–20)

## 2016-12-26 ENCOUNTER — Ambulatory Visit (INDEPENDENT_AMBULATORY_CARE_PROVIDER_SITE_OTHER): Payer: BLUE CROSS/BLUE SHIELD | Admitting: Orthopaedic Surgery

## 2016-12-29 ENCOUNTER — Encounter (INDEPENDENT_AMBULATORY_CARE_PROVIDER_SITE_OTHER): Payer: Self-pay | Admitting: Orthopaedic Surgery

## 2016-12-29 ENCOUNTER — Ambulatory Visit (INDEPENDENT_AMBULATORY_CARE_PROVIDER_SITE_OTHER): Payer: BLUE CROSS/BLUE SHIELD | Admitting: Orthopaedic Surgery

## 2016-12-29 DIAGNOSIS — M25561 Pain in right knee: Secondary | ICD-10-CM

## 2016-12-29 DIAGNOSIS — G8929 Other chronic pain: Secondary | ICD-10-CM | POA: Insufficient documentation

## 2016-12-29 DIAGNOSIS — M25552 Pain in left hip: Secondary | ICD-10-CM | POA: Diagnosis not present

## 2016-12-29 MED ORDER — LIDOCAINE HCL 1 % IJ SOLN
3.0000 mL | INTRAMUSCULAR | Status: AC | PRN
  Administered 2016-12-29: 3 mL

## 2016-12-29 MED ORDER — BUPIVACAINE HCL 0.5 % IJ SOLN
3.0000 mL | INTRAMUSCULAR | Status: AC | PRN
  Administered 2016-12-29: 3 mL via INTRA_ARTICULAR

## 2016-12-29 MED ORDER — METHYLPREDNISOLONE ACETATE 40 MG/ML IJ SUSP
40.0000 mg | INTRAMUSCULAR | Status: AC | PRN
  Administered 2016-12-29: 40 mg via INTRA_ARTICULAR

## 2016-12-29 NOTE — Progress Notes (Signed)
Office Visit Note   Patient: Donna Coleman           Date of Birth: 1971-05-15           MRN: [id number] Visit Date: 12/29/2016              Requested by: Dorena Dew, FNP [address], [place] [address] PCP: Dorena Dew, FNP   Assessment & Plan: Visit Diagnoses:  1. Chronic pain of right knee   2. Pain of left hip joint     Plan: Right knee and left hip trochanteric bursal injection was performed today. Patient tolerates well. Follow-up with me as needed.  Follow-Up Instructions: Return if symptoms worsen or fail to improve.   Orders:  No orders of the defined types were placed in this encounter.  No orders of the defined types were placed in this encounter.     Procedures: Large Joint Inj Date/Time: 12/29/2016 4:14 PM Performed by: Leandrew Koyanagi Authorized by: Leandrew Koyanagi   Consent Given by:  Patient Timeout: prior to procedure the correct patient, procedure, and site was verified   Indications:  Pain Location:  Knee Site:  R knee Prep: patient was prepped and draped in usual sterile fashion   Needle Size:  22 G Ultrasound Guidance: No   Fluoroscopic Guidance: No   Arthrogram: No   Patient tolerance:  Patient tolerated the procedure well with no immediate complications Large Joint Inj Date/Time: 12/29/2016 4:14 PM Performed by: Leandrew Koyanagi Authorized by: Leandrew Koyanagi   Consent Given by:  Patient Timeout: prior to procedure the correct patient, procedure, and site was verified   Indications:  Pain Location:  Hip Site:  L greater trochanter Prep: patient was prepped and draped in usual sterile fashion   Needle Size:  22 G Approach:  Lateral Ultrasound Guidance: No   Fluoroscopic Guidance: No   Arthrogram: No   Medications:  3 mL lidocaine 1 %; 3 mL bupivacaine 0.5 %; 40 mg methylPREDNISolone acetate 40 MG/ML     Clinical Data: No additional findings.   Subjective: Chief Complaint  Patient presents with  . Left  Hip - Pain  . Right Knee - Pain    Donna Coleman comes in today for left hip injection in right knee injection. She is on chronic pain management    Review of Systems   Objective: Vital Signs: LMP 12/20/2016   Physical Exam  Ortho Exam Exam is stable. Specialty Comments:  No specialty comments available.  Imaging: No results found.   PMFS History: Patient Active Problem List   Diagnosis Date Noted  . Chronic pain of right knee 12/29/2016  . Pain of left hip joint 12/29/2016  . Pain of right hip joint 10/30/2016  . Chronic pain of left knee 10/30/2016  . Loss of weight 12/24/2015  . Pain, dental 12/24/2015  . Neuropathy 11/16/2015  . Fibromyalgia syndrome 11/16/2015  . Essential hypertension 06/15/2015  . Hyperlipidemia 06/15/2015  . Chronic pain syndrome 06/15/2015  . Anxiety and depression 06/15/2015  . Memory deficits 08/11/2013  . Myalgia and myositis, unspecified 04/07/2013  . OSA (obstructive sleep apnea) 04/04/2013   Past Medical History:  Diagnosis Date  . Arthritis    neck, central lumbar area  . Cervical spine disease 10/2012  . Chronic pain syndrome   . Depression   . Dyslipidemia   . Fibroid tumor in breast  . Fibromyalgia   . Headache(784.0)   . Hypertension   .  Memory deficits 08/11/2013  . Myalgia and myositis, unspecified 04/07/2013  . Neuropathy   . Obesity   . Osteoarthritis   . RP (retinitis pigmentosa)   . Vitamin D deficiency     Family History  Problem Relation Age of Onset  . Diabetes Maternal Grandmother   . Alzheimer's disease Maternal Grandmother     Past Surgical History:  Procedure Laterality Date  . TUBAL LIGATION     Social History   Occupational History  . 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 Smoking status: Former Smoker    Packs/day: 0.33    Years: 12.00    Types: Cigarettes    Quit date: 06/30/2010  . Smokeless tobacco: Never Used     Comment: 1pack per 3 days.   . Alcohol use No    . Drug use: No  . Sexual activity: Not on file

## 2016-12-30 ENCOUNTER — Ambulatory Visit: Payer: BLUE CROSS/BLUE SHIELD | Admitting: Family Medicine

## 2017-01-08 ENCOUNTER — Ambulatory Visit: Payer: BLUE CROSS/BLUE SHIELD | Admitting: Podiatry

## 2017-01-12 ENCOUNTER — Ambulatory Visit: Payer: BLUE CROSS/BLUE SHIELD | Admitting: Podiatry

## 2017-04-27 ENCOUNTER — Other Ambulatory Visit: Payer: Self-pay

## 2017-04-27 DIAGNOSIS — Z1231 Encounter for screening mammogram for malignant neoplasm of breast: Secondary | ICD-10-CM

## 2017-06-11 ENCOUNTER — Other Ambulatory Visit: Payer: Self-pay | Admitting: Family Medicine

## 2017-06-11 DIAGNOSIS — N644 Mastodynia: Secondary | ICD-10-CM

## 2017-06-12 ENCOUNTER — Other Ambulatory Visit (HOSPITAL_COMMUNITY): Payer: Self-pay | Admitting: *Deleted

## 2017-06-12 DIAGNOSIS — N6320 Unspecified lump in the left breast, unspecified quadrant: Secondary | ICD-10-CM

## 2017-07-02 ENCOUNTER — Ambulatory Visit (HOSPITAL_COMMUNITY): Payer: BLUE CROSS/BLUE SHIELD

## 2017-07-02 ENCOUNTER — Other Ambulatory Visit: Payer: BLUE CROSS/BLUE SHIELD

## 2017-08-20 ENCOUNTER — Ambulatory Visit (HOSPITAL_COMMUNITY): Payer: Self-pay

## 2017-08-20 ENCOUNTER — Other Ambulatory Visit: Payer: Self-pay

## 2017-08-21 ENCOUNTER — Other Ambulatory Visit (HOSPITAL_COMMUNITY): Payer: Self-pay

## 2017-08-21 DIAGNOSIS — N644 Mastodynia: Secondary | ICD-10-CM

## 2017-09-17 ENCOUNTER — Other Ambulatory Visit: Payer: Self-pay

## 2017-09-17 ENCOUNTER — Ambulatory Visit (HOSPITAL_COMMUNITY): Payer: Self-pay

## 2017-10-05 ENCOUNTER — Emergency Department (HOSPITAL_COMMUNITY)
Admission: EM | Admit: 2017-10-05 | Discharge: 2017-10-05 | Disposition: A | Discharge status: Home / Self Care | Payer: Medicaid Other | Attending: Emergency Medicine | Admitting: Emergency Medicine

## 2017-10-05 ENCOUNTER — Encounter (HOSPITAL_COMMUNITY): Payer: Self-pay

## 2017-10-05 ENCOUNTER — Other Ambulatory Visit: Payer: Self-pay

## 2017-10-05 DIAGNOSIS — I10 Essential (primary) hypertension: Secondary | ICD-10-CM | POA: Insufficient documentation

## 2017-10-05 DIAGNOSIS — R21 Rash and other nonspecific skin eruption: Secondary | ICD-10-CM | POA: Diagnosis present

## 2017-10-05 DIAGNOSIS — Z87891 Personal history of nicotine dependence: Secondary | ICD-10-CM | POA: Insufficient documentation

## 2017-10-05 DIAGNOSIS — Z79899 Other long term (current) drug therapy: Secondary | ICD-10-CM | POA: Diagnosis not present

## 2017-10-05 MED ORDER — PERMETHRIN 5 % EX CREA: TOPICAL_CREAM | CUTANEOUS | 0 refills | Status: AC

## 2017-10-05 NOTE — ED Triage Notes (Addendum)
Patient has a rash on bilateral arms, neck, bilateral legs x 5 days.

## 2017-10-05 NOTE — Discharge Instructions (Addendum)
Take over-the-counter antihistamines to help with itching, follow-up with a primary care doctor in a couple of weeks if symptoms have not resolved.  The Elimite cream is to help possible scabies.  Please review the discharge instructions for additional information.

## 2017-10-05 NOTE — ED Notes (Signed)
Bed: WTR9 Expected date:  Expected time:  Means of arrival:  Comments: 

## 2017-10-05 NOTE — ED Provider Notes (Signed)
[facility] DEPT Provider Note   CSN: [id number] Arrival date & time: 10/05/17  0803     History   Chief Complaint Chief Complaint  Patient presents with  . Rash    HPI Donna Coleman is a 47 y.o. female.  HPI Patient presented to the emergency room for evaluation of a rash.  Patient has noticed small bumps on her arms neck and legs for the last 5 days.  Small bumps seem to be coming and going in different locations.  They are very pruritic.  She does have a small dog at home but is not aware of any fleas.  No one else is having similar symptoms.  She has not taken any medications.  She denies any trouble breathing.  No fevers or chills. Past Medical History:  Diagnosis Date  . Arthritis    neck, central lumbar area  . Cervical spine disease 10/2012  . Chronic pain syndrome   . Depression   . Dyslipidemia   . Fibroid tumor in breast  . Fibromyalgia   . Headache(784.0)   . Hypertension   . Memory deficits 08/11/2013  . Myalgia and myositis, unspecified 04/07/2013  . Neuropathy   . Obesity   . Osteoarthritis   . RP (retinitis pigmentosa)   . Vitamin D deficiency     Patient Active Problem List   Diagnosis Date Noted  . Chronic pain of right knee 12/29/2016  . Pain of left hip joint 12/29/2016  . Pain of right hip joint 10/30/2016  . Chronic pain of left knee 10/30/2016  . Loss of weight 12/24/2015  . Pain, dental 12/24/2015  . Neuropathy 11/16/2015  . Fibromyalgia syndrome 11/16/2015  . Essential hypertension 06/15/2015  . Hyperlipidemia 06/15/2015  . Chronic pain syndrome 06/15/2015  . Anxiety and depression 06/15/2015  . Memory deficits 08/11/2013  . Myalgia and myositis, unspecified 04/07/2013  . OSA (obstructive sleep apnea) 04/04/2013    Past Surgical History:  Procedure Laterality Date  . TUBAL LIGATION       OB History   None      Home Medications    Prior to Admission medications   Medication Sig Start Date  End Date Taking? Authorizing Provider  ABILIFY 10 MG tablet Take 10 mg by mouth daily. Reported on 12/24/2015 01/15/15   [provider]  ALPRAZolam Duanne Moron) 1 MG tablet 1 mg daily as needed. Reported on 12/24/2015 02/17/15   [provider]  busPIRone (BUSPAR) 15 MG tablet 15 mg 3 (three) times daily.  01/08/15   [provider]  butalbital-acetaminophen-caffeine (FIORICET) 50-325-40 MG per tablet Take 1 tablet by mouth 4 (four) times daily as needed for headache. Reported on 12/24/2015    [provider]  carisoprodol (SOMA) 350 MG tablet Take 350 mg by mouth 3 (three) times daily as needed for muscle spasms. Reported on 12/24/2015    [provider]  Cholecalciferol (RA VITAMIN D-3) 1000 UNITS tablet Take 1,000 Units by mouth daily. Reported on 12/24/2015    [provider]  clobetasol cream (TEMOVATE) 6.96 % Apply 1 application topically 2 (two) times daily. Reported on 12/24/2015    [provider]  cycloSPORINE (RESTASIS) 0.05 % ophthalmic emulsion Place 1 drop into both eyes 2 (two) times daily as needed (for eye irritation). Reported on 12/24/2015    [provider]  gabapentin (NEURONTIN) 400 MG capsule Take 1 capsule (400 mg total) by mouth 4 (four) times daily. Patient taking differently: Take 800 mg  by mouth 4 (four) times daily.  05/07/16   Micheline Chapman, NP  losartan (COZAAR) 50 MG tablet Take 1 tablet (50 mg total) by mouth daily. 12/22/16   Dorena Dew, FNP  morphine (MSIR) 15 MG tablet Take 15 mg by mouth 2 (two) times daily.    [provider]  oxyCODONE (ROXICODONE) 15 MG immediate release tablet Take 1 tablet (15 mg total) by mouth every 4 (four) hours as needed for pain. Patient taking differently: Take 20 mg by mouth every 4 (four) hours as needed for pain.  07/06/15   Konrad Felix, PA  permethrin Nancy Fetter) 5 % cream Apply to affected area once 10/05/17   Dorie Rank, MD  Vitamin D, Ergocalciferol,  (DRISDOL) 50000 UNITS CAPS capsule Reported on 12/24/2015 01/08/15   [provider]    Family History Family History  Problem Relation Age of Onset  . Diabetes Maternal Grandmother   . Alzheimer's disease Maternal Grandmother     Social History Social History   Tobacco Use  . Smoking status: Former Smoker    Packs/day: 0.33    Years: 12.00    Pack years: 3.96    Types: Cigarettes    Last attempt to quit: 06/30/2010    Years since quitting: 7.2  . Smokeless tobacco: Never Used  . Tobacco comment: 1pack per 3 days.   Substance Use Topics  . Alcohol use: No    Alcohol/week: 0.0 oz  . Drug use: No     Allergies   Patient has no known allergies.   Review of Systems Review of Systems  All other systems reviewed and are negative.    Physical Exam Updated Vital Signs BP (!) 150/96 (BP Location: Right Arm)   Pulse 75   Temp 98.4 F (36.9 C) (Oral)   Resp 18   Ht 1.588 m (5' 2.5")   Wt 72.6 kg (160 lb)   LMP 10/05/2017   SpO2 99%   BMI 28.80 kg/m   Physical Exam  Constitutional: She appears well-developed and well-nourished. No distress.  HENT:  Head: Normocephalic and atraumatic.  Right Ear: External ear normal.  Left Ear: External ear normal.  Eyes: Conjunctivae are normal. Right eye exhibits no discharge. Left eye exhibits no discharge. No scleral icterus.  Neck: Neck supple. No tracheal deviation present.  Cardiovascular: Normal rate and regular rhythm.  Pulmonary/Chest: Effort normal. No stridor. No respiratory distress.  Abdominal: She exhibits no distension.  Musculoskeletal: She exhibits no edema.  Neurological: She is alert. Cranial nerve deficit: no gross deficits.  Skin: Skin is warm and dry. Rash noted.  Small papules on her extremities, no pustules, no petechiae or purpura.  No urticaria.  Psychiatric: She has a normal mood and affect.  Nursing note and vitals reviewed.    ED Treatments / Results  Labs (all labs ordered are listed,  but only abnormal results are displayed) Labs Reviewed - No data to display  EKG None  Radiology No results found.  Procedures Procedures (including critical care time)  Medications Ordered in ED Medications - No data to display   Initial Impression / Assessment and Plan / ED Course  I have reviewed the triage vital signs and the nursing notes.  Pertinent labs & imaging results that were available during my care of the patient were reviewed by me and considered in my medical decision making (see chart for details).   Is possible the symptoms could be related to flea bites from her dog.  Scabies is also another consideration.  I will have the patient take Elimite.  I recommended following up with a primary care doctor in a couple weeks if her symptoms have not resolved.  She can also take over-the-counter anti-histamines for the itching.  Final Clinical Impressions(s) / ED Diagnoses   Final diagnoses:  Rash    ED Discharge Orders        Ordered    permethrin (ELIMITE) 5 % cream     10/05/17 0950       Dorie Rank, MD 10/05/17 [phone]

## 2017-12-09 ENCOUNTER — Emergency Department (HOSPITAL_COMMUNITY)
Admission: EM | Admit: 2017-12-09 | Discharge: 2017-12-09 | Discharge status: Against Medical Advice | Payer: Medicaid Other | Attending: Emergency Medicine | Admitting: Emergency Medicine

## 2017-12-09 ENCOUNTER — Encounter (HOSPITAL_COMMUNITY): Payer: Self-pay | Admitting: Family Medicine

## 2017-12-09 DIAGNOSIS — M25552 Pain in left hip: Secondary | ICD-10-CM | POA: Diagnosis not present

## 2017-12-09 DIAGNOSIS — Y9259 Other trade areas as the place of occurrence of the external cause: Secondary | ICD-10-CM | POA: Insufficient documentation

## 2017-12-09 DIAGNOSIS — W19XXXA Unspecified fall, initial encounter: Secondary | ICD-10-CM

## 2017-12-09 DIAGNOSIS — Z87891 Personal history of nicotine dependence: Secondary | ICD-10-CM | POA: Insufficient documentation

## 2017-12-09 DIAGNOSIS — M542 Cervicalgia: Secondary | ICD-10-CM | POA: Diagnosis not present

## 2017-12-09 DIAGNOSIS — R51 Headache: Secondary | ICD-10-CM | POA: Diagnosis present

## 2017-12-09 DIAGNOSIS — Y999 Unspecified external cause status: Secondary | ICD-10-CM | POA: Diagnosis not present

## 2017-12-09 DIAGNOSIS — Z79899 Other long term (current) drug therapy: Secondary | ICD-10-CM | POA: Diagnosis not present

## 2017-12-09 DIAGNOSIS — Z5329 Procedure and treatment not carried out because of patient's decision for other reasons: Secondary | ICD-10-CM | POA: Insufficient documentation

## 2017-12-09 DIAGNOSIS — W01198A Fall on same level from slipping, tripping and stumbling with subsequent striking against other object, initial encounter: Secondary | ICD-10-CM | POA: Insufficient documentation

## 2017-12-09 DIAGNOSIS — I10 Essential (primary) hypertension: Secondary | ICD-10-CM | POA: Insufficient documentation

## 2017-12-09 DIAGNOSIS — M25561 Pain in right knee: Secondary | ICD-10-CM | POA: Diagnosis not present

## 2017-12-09 DIAGNOSIS — Y9389 Activity, other specified: Secondary | ICD-10-CM | POA: Diagnosis not present

## 2017-12-09 HISTORY — DX: Other intervertebral disc degeneration, lumbar region: M51.36

## 2017-12-09 HISTORY — DX: Other intervertebral disc degeneration, lumbar region without mention of lumbar back pain or lower extremity pain: M51.369

## 2017-12-09 NOTE — ED Provider Notes (Signed)
[facility] DEPT Provider Note   CSN: [id number] Arrival date & time: 12/09/17  1035     History   Chief Complaint Chief Complaint  Patient presents with  . Fall    HPI Donna Coleman is a 47 y.o. female with a hx of osteoarthritis, DDD, chronic pain syndrome (on morphine MRSIR and oxycodone IR), depression, fibromyalgia, HTN, and obesity who presents to the ED with complaints of pain in multiple locations s/p mechanical fall 2 days ago. Patient states she was at a store and slipped on the wet floor. She fell onto her L side/hip area. She states she did hit her head on the floor, no LOC. She needed assistance getting up, she has been ambulatory since the event. She states she has had intermittent headaches (gradual onset, steady progression, similar to previous), neck pain, L hip pain, and R knee pain. She rates her pain a 10/10 in severity, worse with movement, no specific alleviating factors. She has taken her oxycodone without much relief. States she is currently out of her morphine which she feels would help. She is followed by pain management. She has had some nausea without vomiting. Denies numbness, weakness, incontinence, chest pain, or abdominal pain.   HPI  Past Medical History:  Diagnosis Date  . Arthritis    neck, central lumbar area  . Cervical spine disease 10/2012  . Chronic pain syndrome   . DDD (degenerative disc disease), lumbar   . Depression   . Dyslipidemia   . Fibroid tumor in breast  . Fibromyalgia   . Headache(784.0)   . Hypertension   . Memory deficits 08/11/2013  . Myalgia and myositis, unspecified 04/07/2013  . Neuropathy   . Obesity   . Osteoarthritis   . RP (retinitis pigmentosa)   . Vitamin D deficiency     Patient Active Problem List   Diagnosis Date Noted  . Chronic pain of right knee 12/29/2016  . Pain of left hip joint 12/29/2016  . Pain of right hip joint 10/30/2016  . Chronic pain of left knee 10/30/2016    . Loss of weight 12/24/2015  . Pain, dental 12/24/2015  . Neuropathy 11/16/2015  . Fibromyalgia syndrome 11/16/2015  . Essential hypertension 06/15/2015  . Hyperlipidemia 06/15/2015  . Chronic pain syndrome 06/15/2015  . Anxiety and depression 06/15/2015  . Memory deficits 08/11/2013  . Myalgia and myositis, unspecified 04/07/2013  . OSA (obstructive sleep apnea) 04/04/2013    Past Surgical History:  Procedure Laterality Date  . TUBAL LIGATION       OB History   None      Home Medications    Prior to Admission medications   Medication Sig Start Date End Date Taking? Authorizing Provider  ABILIFY 10 MG tablet Take 10 mg by mouth daily. Reported on 12/24/2015 01/15/15   [provider]  ALPRAZolam Duanne Moron) 1 MG tablet 1 mg daily as needed. Reported on 12/24/2015 02/17/15   [provider]  busPIRone (BUSPAR) 15 MG tablet 15 mg 3 (three) times daily.  01/08/15   [provider]  butalbital-acetaminophen-caffeine (FIORICET) 50-325-40 MG per tablet Take 1 tablet by mouth 4 (four) times daily as needed for headache. Reported on 12/24/2015    [provider]  carisoprodol (SOMA) 350 MG tablet Take 350 mg by mouth 3 (three) times daily as needed for muscle spasms. Reported on 12/24/2015    [provider]  Cholecalciferol (RA VITAMIN D-3) 1000 UNITS tablet Take 1,000 Units by mouth daily.  Reported on 12/24/2015    [provider]  clobetasol cream (TEMOVATE) 6.83 % Apply 1 application topically 2 (two) times daily. Reported on 12/24/2015    [provider]  cycloSPORINE (RESTASIS) 0.05 % ophthalmic emulsion Place 1 drop into both eyes 2 (two) times daily as needed (for eye irritation). Reported on 12/24/2015    [provider]  gabapentin (NEURONTIN) 400 MG capsule Take 1 capsule (400 mg total) by mouth 4 (four) times daily. Patient taking differently: Take 800 mg by mouth 4 (four) times daily.  05/07/16   Micheline Chapman,  NP  losartan (COZAAR) 50 MG tablet Take 1 tablet (50 mg total) by mouth daily. 12/22/16   Dorena Dew, FNP  morphine (MSIR) 15 MG tablet Take 15 mg by mouth 2 (two) times daily.    [provider]  oxyCODONE (ROXICODONE) 15 MG immediate release tablet Take 1 tablet (15 mg total) by mouth every 4 (four) hours as needed for pain. Patient taking differently: Take 20 mg by mouth every 4 (four) hours as needed for pain.  07/06/15   Konrad Felix, PA  permethrin Nancy Fetter) 5 % cream Apply to affected area once 10/05/17   Dorie Rank, MD  Vitamin D, Ergocalciferol, (DRISDOL) 50000 UNITS CAPS capsule Reported on 12/24/2015 01/08/15   [provider]    Family History Family History  Problem Relation Age of Onset  . Diabetes Maternal Grandmother   . Alzheimer's disease Maternal Grandmother     Social History Social History   Tobacco Use  . Smoking status: Former Smoker    Packs/day: 0.33    Years: 12.00    Pack years: 3.96    Types: Cigarettes    Last attempt to quit: 06/30/2010    Years since quitting: 7.4  . Smokeless tobacco: Never Used  . Tobacco comment: 1pack per 3 days.   Substance Use Topics  . Alcohol use: No    Alcohol/week: 0.0 oz  . Drug use: No     Allergies   Patient has no known allergies.   Review of Systems Review of Systems  Constitutional: Negative for chills and fever.  HENT: Negative for ear discharge.   Eyes: Negative for visual disturbance.  Respiratory: Negative for shortness of breath.   Cardiovascular: Negative for chest pain.  Gastrointestinal: Positive for nausea. Negative for abdominal pain and vomiting.  Musculoskeletal: Positive for arthralgias and neck pain. Negative for back pain.  Neurological: Positive for headaches. Negative for dizziness, weakness and numbness.  Psychiatric/Behavioral: Negative for confusion.     Physical Exam Updated Vital Signs BP 134/83 (BP Location: Right Arm)   Pulse 77   Temp 98.1 F (36.7 C)  (Oral)   Resp 20   Ht 5\' 2"  (1.575 m)   Wt 74.8 kg (165 lb)   SpO2 100%   BMI 30.18 kg/m   Physical Exam  Constitutional: She appears well-developed and well-nourished.  Non-toxic appearance. No distress.  HENT:  Head: Normocephalic and atraumatic. Head is without raccoon's eyes and without Battle's sign.  Right Ear: No hemotympanum.  Left Ear: No hemotympanum.  Nose: No rhinorrhea.  Mouth/Throat: Uvula is midline, oropharynx is clear and moist and mucous membranes are normal.  Eyes: Pupils are equal, round, and reactive to light. Conjunctivae and EOM are normal. Right eye exhibits no discharge. Left eye exhibits no discharge.  Neck: Normal range of motion. Neck supple. Spinous process tenderness (diffuse, non focal) and muscular tenderness (bilateral, L>R) present.  Cardiovascular: Normal rate  and regular rhythm.  No murmur heard. Pulses:      Radial pulses are 2+ on the right side, and 2+ on the left side.       Posterior tibial pulses are 2+ on the right side, and 2+ on the left side.  Pulmonary/Chest: Breath sounds normal. No respiratory distress. She has no wheezes. She has no rales.  Abdominal: Soft. She exhibits no distension. There is no tenderness.  Musculoskeletal:  No obvious deformity, appreciable swelling, erythema, or ecchymosis.  No open wounds. Back: No point/focal midline tenderness. Upper extremities: Normal range of motion.  Nontender.   Lower extremities: Patient has normal range of motion to bilateral hips, knees, and ankles.  Patient is diffusely tender about the left hip and left gluteal region.  She is also diffusely tender about the right knee.  There is no focal/point tenderness to palpation.  She is neurovascularly intact distally.  Neurological:  Alert.  Clear speech.  CN III through XII grossly intact.  Sensation grossly intact bilateral upper and lower extremities.  Patient has 5 out of 5 symmetric grip strength.  She has 5 out of 5 strength with bilateral  hips with flexion/extension/abduction/abduction.  She also has 5 out of 5 symmetric strength with plantar and dorsiflexion at the ankle.  Gait is intact.  Skin: Skin is warm and dry. No rash noted.  Psychiatric: She has a normal mood and affect. Her behavior is normal.  Nursing note and vitals reviewed.    ED Treatments / Results  Labs (all labs ordered are listed, but only abnormal results are displayed) Labs Reviewed - No data to display  EKG None  Radiology No results found.  Procedures Procedures (including critical care time)  Medications Ordered in ED Medications - No data to display   Initial Impression / Assessment and Plan / ED Course  I have reviewed the triage vital signs and the nursing notes.  Pertinent labs & imaging results that were available during my care of the patient were reviewed by me and considered in my medical decision making (see chart for details).   Patient presents s/p mechanical fall complaining of pain in multiple locations. Patient has diffuse tenderness to the cervical region, L hip, and R knee. She has no point/focal tenderness to vertebra or other areas of pain, no focal neurologic deficits, and is NVI distally in all areas of discomfort. She is ambulatory in the emergency department.  Patient requesting imaging for further evaluation. Plan for X-ray of L hip and R knee as well as CT head and Cspine-  Discussed low suspicion head bleed, patient adamant hence CT head being ordered.   EDT informed me patient has left the department due to her ride being here. Neither myself or RN were able to have a formal AMA discussion with the patient therefore disposition eloped. Patient appeared hemodynamically stable upon my initial evaluation, low suspicion for serious head/neck/back injury or fracture/dislocations to other areas of discomfort.   Final Clinical Impressions(s) / ED Diagnoses   Final diagnoses:  Fall, initial encounter    ED Discharge  Orders    None       Amaryllis Dyke, PA-C 12/09/17 1314    [name], MD 12/09/17 1630

## 2017-12-09 NOTE — ED Notes (Signed)
Bed: WTR7 Expected date:  Expected time:  Means of arrival:  Comments: 

## 2017-12-09 NOTE — ED Triage Notes (Signed)
Patient reports she slipped on a wet floor at a store. This occurred on Monday. Patient reports she fell on her left side, landed on laminate floor. Patient is complaining of headache, neck pain, right knee, and left hip pain. Denies any LOC but she did hit her head on the floor.

## 2017-12-09 NOTE — ED Notes (Signed)
Pt states she is leaving due to her ride being here to pick her up

## 2018-03-04 ENCOUNTER — Ambulatory Visit (INDEPENDENT_AMBULATORY_CARE_PROVIDER_SITE_OTHER): Payer: Medicaid Other | Admitting: Family Medicine

## 2018-03-04 ENCOUNTER — Encounter: Payer: Self-pay | Admitting: Family Medicine

## 2018-03-04 VITALS — BP 170/88 | HR 76 | Temp 98.0°F | Resp 14 | Ht 62.5 in | Wt 166.0 lb

## 2018-03-04 DIAGNOSIS — A599 Trichomoniasis, unspecified: Secondary | ICD-10-CM | POA: Diagnosis not present

## 2018-03-04 DIAGNOSIS — Z113 Encounter for screening for infections with a predominantly sexual mode of transmission: Secondary | ICD-10-CM

## 2018-03-04 DIAGNOSIS — Z0000 Encounter for general adult medical examination without abnormal findings: Secondary | ICD-10-CM | POA: Diagnosis not present

## 2018-03-04 DIAGNOSIS — Z01419 Encounter for gynecological examination (general) (routine) without abnormal findings: Secondary | ICD-10-CM

## 2018-03-04 NOTE — Progress Notes (Signed)
    [facility]  Annual GYN Examination Provider: Lanae Boast, FNP   SUBJECTIVE: Donna Coleman is a 47 y.o. female who presents for her annual gynecological examination.  Current concerns: none    GYNECOLOGICAL HISTORY: Patient's last menstrual period was 02/15/2018. Contraception: condoms Last Pap: unknown.  Last mammogram:none.  OBSTETRIC HISTORY: OB History  No data available     The following portions of the patient's history were reviewed and updated as appropriate: allergies, current medications, past family history, past medical history, past social history, past surgical history and problem list.  REVIEW OF SYSTEMS: Review of Systems  Constitutional: Negative.   HENT: Negative.   Eyes: Negative.   Respiratory: Negative.   Cardiovascular: Negative.   Gastrointestinal: Negative.   Genitourinary: Negative.   Musculoskeletal: Negative.   Skin: Negative.   Neurological: Negative.   Psychiatric/Behavioral: Negative.       OBJECTIVE:  Physical Exam  Constitutional: She appears well-developed and well-nourished.  Genitourinary: Rectum normal and uterus normal. Vaginal discharge found. Right adnexum does not display mass and does not display tenderness. Left adnexum does not display mass and does not display tenderness.  Cervix exhibits discharge. Cervix does not exhibit motion tenderness.  Genitourinary Comments: +whiff test  HENT:  Head: Normocephalic and atraumatic.  Eyes: Pupils are equal, round, and reactive to light. Conjunctivae and EOM are normal.  Neck: Normal range of motion. Neck supple.  Cardiovascular: Normal rate, regular rhythm, normal heart sounds and intact distal pulses.  Pulmonary/Chest: Effort normal and breath sounds normal. No respiratory distress.  Abdominal: Soft. Bowel sounds are normal.  Skin: Skin is warm and dry.  Psychiatric: She has a normal mood and affect. Her behavior is  normal. Judgment and thought content normal.  Nursing note and vitals reviewed.    ASSESSMENT/PLAN:  1. Well woman exam with routine gynecological exam - HIV antibody (with reflex) - RPR - Pap IG, CT/NG NAA, and HPV (high risk) [facility]  2. Screen for sexually transmitted diseases - HIV antibody (with reflex) - RPR     Education reviewed: calcium supplements, low fat, low cholesterol diet, safe sex/STD prevention, self breast exams and weight bearing exercise. Contraception: condoms. Follow up in: 6 months.    Return to care as scheduled and prn. Patient verbalized understanding and agreed with plan of care.   Donna Coleman. Donna Canary, FNP-BC [facility] [address] [place], [place] [address] [phone]

## 2018-03-05 LAB — RPR: RPR Ser Ql: NONREACTIVE

## 2018-03-05 LAB — HIV ANTIBODY (ROUTINE TESTING W REFLEX): HIV Screen 4th Generation wRfx: NONREACTIVE

## 2018-03-10 ENCOUNTER — Telehealth: Payer: Self-pay | Admitting: Family Medicine

## 2018-03-10 LAB — PAP IG, CT-NG NAA, HPV HIGH-RISK
Chlamydia, Nuc. Acid Amp: NEGATIVE
Gonococcus by Nucleic Acid Amp: NEGATIVE
HPV, high-risk: NEGATIVE
PAP Smear Comment: 0

## 2018-03-10 MED ORDER — METRONIDAZOLE 500 MG PO TABS: 1000.0000 mg | ORAL_TABLET | Freq: Two times a day (BID) | ORAL | 0 refills | Status: AC

## 2018-03-10 NOTE — Telephone Encounter (Signed)
Please inform patient that she has trich.  Trichomoniasis (or "trich") is a very common sexually transmitted disease (STD). It is caused by infection with a protozoan parasite called Trichomonas vaginalis. Although symptoms of the disease vary, most people who have the parasite cannot tell they are infected.  I sent flagyl to the pharmacy on file.

## 2018-03-11 NOTE — Telephone Encounter (Signed)
Called and spoke with patient advised that she was positive for trich and that should should take flagyl as prescribed until complete. Advised that she should have partner treated and refrain from having sex for 7 days. Patient verbalized understanding. Thanks!

## 2018-04-01 ENCOUNTER — Ambulatory Visit: Payer: Medicaid Other | Admitting: Family Medicine

## 2018-05-25 ENCOUNTER — Ambulatory Visit (INDEPENDENT_AMBULATORY_CARE_PROVIDER_SITE_OTHER): Payer: BLUE CROSS/BLUE SHIELD | Admitting: Orthopaedic Surgery

## 2018-07-04 ENCOUNTER — Emergency Department (HOSPITAL_COMMUNITY)
Admission: EM | Admit: 2018-07-04 | Discharge: 2018-07-04 | Disposition: A | Discharge status: Home / Self Care | Payer: Medicaid Other | Attending: Emergency Medicine | Admitting: Emergency Medicine

## 2018-07-04 ENCOUNTER — Encounter (HOSPITAL_COMMUNITY): Payer: Self-pay

## 2018-07-04 DIAGNOSIS — Z79899 Other long term (current) drug therapy: Secondary | ICD-10-CM | POA: Insufficient documentation

## 2018-07-04 DIAGNOSIS — H00015 Hordeolum externum left lower eyelid: Secondary | ICD-10-CM | POA: Diagnosis not present

## 2018-07-04 DIAGNOSIS — I10 Essential (primary) hypertension: Secondary | ICD-10-CM | POA: Insufficient documentation

## 2018-07-04 DIAGNOSIS — H029 Unspecified disorder of eyelid: Secondary | ICD-10-CM | POA: Diagnosis present

## 2018-07-04 DIAGNOSIS — Z87891 Personal history of nicotine dependence: Secondary | ICD-10-CM | POA: Insufficient documentation

## 2018-07-04 MED ORDER — ERYTHROMYCIN 5 MG/GM OP OINT
TOPICAL_OINTMENT | Freq: Once | OPHTHALMIC | Status: AC
  Administered 2018-07-04: 16:00:00 via OPHTHALMIC
  Filled 2018-07-04: qty 3.5

## 2018-07-04 NOTE — ED Provider Notes (Signed)
[facility] DEPT Provider Note   CSN: [id number] Arrival date & time: 07/04/18  1222     History   Chief Complaint Chief Complaint  Patient presents with  . Eyelid Pain    HPI Donna L Klasen is a 48 y.o. female who presents to the ED with left eye itching and a bump to the lower lid. Patient reports the symptoms started last night and are worse today.   HPI  Past Medical History:  Diagnosis Date  . Arthritis    neck, central lumbar area  . Cervical spine disease 10/2012  . Chronic pain syndrome   . DDD (degenerative disc disease), lumbar   . Depression   . Dyslipidemia   . Fibroid tumor in breast  . Fibromyalgia   . Headache(784.0)   . Hypertension   . Memory deficits 08/11/2013  . Myalgia and myositis, unspecified 04/07/2013  . Neuropathy   . Obesity   . Osteoarthritis   . RP (retinitis pigmentosa)   . Vitamin D deficiency     Patient Active Problem List   Diagnosis Date Noted  . Chronic pain of right knee 12/29/2016  . Pain of left hip joint 12/29/2016  . Pain of right hip joint 10/30/2016  . Chronic pain of left knee 10/30/2016  . Loss of weight 12/24/2015  . Pain, dental 12/24/2015  . Neuropathy 11/16/2015  . Fibromyalgia syndrome 11/16/2015  . Essential hypertension 06/15/2015  . Hyperlipidemia 06/15/2015  . Chronic pain syndrome 06/15/2015  . Anxiety and depression 06/15/2015  . Memory deficits 08/11/2013  . Myalgia and myositis, unspecified 04/07/2013  . OSA (obstructive sleep apnea) 04/04/2013    Past Surgical History:  Procedure Laterality Date  . TUBAL LIGATION       OB History   No obstetric history on file.      Home Medications    Prior to Admission medications   Medication Sig Start Date End Date Taking? Authorizing Provider  ABILIFY 10 MG tablet Take 10 mg by mouth daily. Reported on 12/24/2015 01/15/15   [provider]  ALPRAZolam Duanne Moron) 1 MG tablet 1 mg daily as needed. Reported on  12/24/2015 02/17/15   [provider]  Buprenorphine HCl (BELBUCA) 600 MCG FILM Place 1 each inside cheek 2 (two) times daily.    [provider]  busPIRone (BUSPAR) 15 MG tablet 15 mg 3 (three) times daily.  01/08/15   [provider]  butalbital-acetaminophen-caffeine (FIORICET) 50-325-40 MG per tablet Take 1 tablet by mouth 4 (four) times daily as needed for headache. Reported on 12/24/2015    [provider]  carisoprodol (SOMA) 350 MG tablet Take 350 mg by mouth 3 (three) times daily as needed for muscle spasms. Reported on 12/24/2015    [provider]  Cholecalciferol (RA VITAMIN D-3) 1000 UNITS tablet Take 1,000 Units by mouth daily. Reported on 12/24/2015    [provider]  clobetasol cream (TEMOVATE) 8.10 % Apply 1 application topically 2 (two) times daily. Reported on 12/24/2015    [provider]  cycloSPORINE (RESTASIS) 0.05 % ophthalmic emulsion Place 1 drop into both eyes 2 (two) times daily as needed (for eye irritation). Reported on 12/24/2015    [provider]  gabapentin (NEURONTIN) 400 MG capsule Take 1 capsule (400 mg total) by mouth 4 (four) times daily. Patient taking differently: Take 300 mg by mouth 3 (three) times daily.  05/07/16   Micheline Chapman, NP  lisinopril (PRINIVIL,ZESTRIL) 10 MG tablet Take 10 mg  by mouth 2 (two) times daily.    [provider]  losartan (COZAAR) 50 MG tablet Take 1 tablet (50 mg total) by mouth daily. Patient not taking: Reported on 03/04/2018 12/22/16   Dorena Dew, FNP  morphine (MSIR) 15 MG tablet Take 15 mg by mouth 2 (two) times daily.    [provider]  oxyCODONE (ROXICODONE) 15 MG immediate release tablet Take 1 tablet (15 mg total) by mouth every 4 (four) hours as needed for pain. Patient taking differently: Take 20 mg by mouth every 4 (four) hours as needed for pain.  07/06/15   Konrad Felix, PA  permethrin (ELIMITE) 5 % cream Apply to affected  area once Patient not taking: Reported on 03/04/2018 10/05/17   Dorie Rank, MD  Vitamin D, Ergocalciferol, (DRISDOL) 50000 UNITS CAPS capsule Reported on 12/24/2015 01/08/15   [provider]    Family History Family History  Problem Relation Age of Onset  . Diabetes Maternal Grandmother   . Alzheimer's disease Maternal Grandmother     Social History Social History   Tobacco Use  . Smoking status: Former Smoker    Packs/day: 0.33    Years: 12.00    Pack years: 3.96    Types: Cigarettes    Last attempt to quit: 06/30/2010    Years since quitting: 8.0  . Smokeless tobacco: Never Used  . Tobacco comment: 1pack per 3 days.   Substance Use Topics  . Alcohol use: No    Alcohol/week: 0.0 standard drinks  . Drug use: No     Allergies   Patient has no known allergies.   Review of Systems Review of Systems  Eyes: Positive for discharge, redness and itching.  All other systems reviewed and are negative.    Physical Exam Updated Vital Signs BP (!) 153/108 (BP Location: Left Arm)   Pulse (!) 102   Temp 98.3 F (36.8 C) (Oral)   Resp 18   Ht 5\' 2"  (1.575 m)   Wt 64 kg   LMP 06/20/2018 (Approximate)   SpO2 99%   BMI 25.79 kg/m   Physical Exam Vitals signs and nursing note reviewed.  Constitutional:      General: She is not in acute distress.    Appearance: She is well-developed.  HENT:     Head: Normocephalic.     Nose: Nose normal.     Mouth/Throat:     Mouth: Mucous membranes are moist.  Eyes:     General:        Left eye: Hordeolum present.    Extraocular Movements: Extraocular movements intact.     Conjunctiva/sclera:     Left eye: Left conjunctiva is injected.  Neck:     Musculoskeletal: Neck supple.  Pulmonary:     Effort: Pulmonary effort is normal.  Abdominal:     Palpations: Abdomen is soft.     Tenderness: There is no abdominal tenderness.  Musculoskeletal: Normal range of motion.  Skin:    General: Skin is warm and dry.  Neurological:      Mental Status: She is alert and oriented to person, place, and time.  Psychiatric:        Mood and Affect: Mood normal.      ED Treatments / Results  Labs (all labs ordered are listed, but only abnormal results are displayed) Labs Reviewed - No data to display  Radiology No results found.  Procedures Procedures (including critical care time)  Medications Ordered in ED Medications  erythromycin  ophthalmic ointment (has no administration in time range)     Initial Impression / Assessment and Plan / ED Course  I have reviewed the triage vital signs and the nursing notes. Crysten L Devivo presents with symptoms consistent with a stye to the left lower lid. No entrapment.  Presentation non-concerning for iritis, corneal abrasions, or HSV.  No evidence of preseptal or orbital cellulitis.  Pt is not a contact lens wearer.  Patient will be given erythromycin ophthalmic.  Personal hygiene and frequent handwashing discussed.  Patient advised to followup with PCP for reevaluation.  Patient verbalizes understanding and is agreeable with discharge.     Final Clinical Impressions(s) / ED Diagnoses   Final diagnoses:  Hordeolum externum left lower eyelid    ED Discharge Orders    None       Debroah Baller [place], [place] 07/04/18 1551    Quintella Reichert, MD 07/05/18 [phone]

## 2018-07-04 NOTE — Discharge Instructions (Addendum)
Use the eye ointment 3 times a day for the next 5 to 7 days. Follow up with your doctor for recheck.

## 2018-07-04 NOTE — ED Triage Notes (Signed)
Pt presents with c/o left eyelid pain. Pt appears to have a stye on the bottom of her eyelid. Pt reports the area is painful and itches.

## 2018-07-07 ENCOUNTER — Telehealth: Payer: Self-pay | Admitting: General Practice

## 2018-07-07 NOTE — Telephone Encounter (Signed)
Very sorry, unable to accept as new pt, thanks

## 2018-07-07 NOTE — Telephone Encounter (Signed)
Copied from [facility] [id number]. Topic: Appointment Scheduling - Scheduling Inquiry for Clinic >> Jul 07, 2018  3:51 PM Leward Quan A wrote: Reason for CRM: Patient is requesting to become a new patient she requested services with Dr Jenny Reichmann. Patient was informed that he is not taking on any new patients at the moment but the patient insist on becoming a patient of his please advise. Ph# [phone]

## 2018-07-08 ENCOUNTER — Ambulatory Visit: Payer: Medicaid Other | Admitting: Nurse Practitioner

## 2018-07-08 NOTE — Telephone Encounter (Signed)
LVM to inform patient Dr.John is unable to accept her as a new patient.  She does already have her appointments made with [facility].

## 2018-09-29 ENCOUNTER — Ambulatory Visit: Payer: Medicaid Other | Admitting: Nurse Practitioner

## 2018-12-10 ENCOUNTER — Other Ambulatory Visit: Payer: Self-pay

## 2018-12-10 ENCOUNTER — Telehealth: Payer: Self-pay | Admitting: Orthopaedic Surgery

## 2018-12-10 ENCOUNTER — Ambulatory Visit (INDEPENDENT_AMBULATORY_CARE_PROVIDER_SITE_OTHER): Payer: Medicaid Other | Admitting: Orthopaedic Surgery

## 2018-12-10 DIAGNOSIS — M25561 Pain in right knee: Secondary | ICD-10-CM | POA: Diagnosis not present

## 2018-12-10 DIAGNOSIS — G8929 Other chronic pain: Secondary | ICD-10-CM

## 2018-12-10 MED ORDER — BUPIVACAINE HCL 0.5 % IJ SOLN
2.0000 mL | INTRAMUSCULAR | Status: AC | PRN
  Administered 2018-12-10: 2 mL via INTRA_ARTICULAR

## 2018-12-10 MED ORDER — LIDOCAINE HCL 1 % IJ SOLN
2.0000 mL | INTRAMUSCULAR | Status: AC | PRN
  Administered 2018-12-10: 2 mL

## 2018-12-10 MED ORDER — METHYLPREDNISOLONE ACETATE 40 MG/ML IJ SUSP
40.0000 mg | INTRAMUSCULAR | Status: AC | PRN
  Administered 2018-12-10: 40 mg via INTRA_ARTICULAR

## 2018-12-10 NOTE — Progress Notes (Signed)
Office Visit Note   Patient: Donna Coleman           Date of Birth: 1971/06/14           MRN: [id number] Visit Date: 12/10/2018              Requested by: Lanae Boast, [address] [place],  [place] [address] PCP: Lanae Boast, FNP   Assessment & Plan: Visit Diagnoses:  1. Chronic pain of right knee     Plan: Impression is right knee osteoarthritis with moderate effusion and resolved effusion of the left knee.  The right knee was aspirated and injected with cortisone today.  Patient tolerated this well.  Fluid was sent to the lab.  Follow-up as needed.  Follow-Up Instructions: Return if symptoms worsen or fail to improve.   Orders:  No orders of the defined types were placed in this encounter.  No orders of the defined types were placed in this encounter.     Procedures: Large Joint Inj: R knee on 12/10/2018 8:08 PM Indications: pain Details: 22 G needle  Arthrogram: No  Medications: 40 mg methylPREDNISolone acetate 40 MG/ML; 2 mL lidocaine 1 %; 2 mL bupivacaine 0.5 % Aspirate: 40 mL blood-tinged; sent for lab analysis Outcome: tolerated well, no immediate complications Consent was given by the patient. Patient was prepped and draped in the usual sterile fashion.       Clinical Data: No additional findings.   Subjective: Chief Complaint  Patient presents with  . Right Knee - Pain  . Left Knee - Pain    Donna Coleman comes in today for evaluation of bilateral knee pain worse on the right.  She recently had her left knee aspirated by another doctor.  She states that the right knee effusion that was also aspirated has reaccumulated.  Denies any constitutional symptoms.  She is requesting a repeat aspiration and cortisone injection today.   Review of Systems  Constitutional: Negative.   HENT: Negative.   Eyes: Negative.   Respiratory: Negative.   Cardiovascular: Negative.   Endocrine: Negative.   Musculoskeletal: Negative.   Neurological:  Negative.   Hematological: Negative.   Psychiatric/Behavioral: Negative.   All other systems reviewed and are negative.    Objective: Vital Signs: There were no vitals taken for this visit.  Physical Exam Vitals signs and nursing note reviewed.  Constitutional:      Appearance: She is well-developed.  Pulmonary:     Effort: Pulmonary effort is normal.  Skin:    General: Skin is warm.     Capillary Refill: Capillary refill takes less than 2 seconds.  Neurological:     Mental Status: She is alert and oriented to person, place, and time.  Psychiatric:        Behavior: Behavior normal.        Thought Content: Thought content normal.        Judgment: Judgment normal.     Ortho Exam Right knee exam shows a moderate joint effusion.  Otherwise exam is unremarkable. Specialty Comments:  No specialty comments available.  Imaging: No results found.   PMFS History: Patient Active Problem List   Diagnosis Date Noted  . Chronic pain of right knee 12/29/2016  . Pain of left hip joint 12/29/2016  . Pain of right hip joint 10/30/2016  . Chronic pain of left knee 10/30/2016  . Loss of weight 12/24/2015  . Pain, dental 12/24/2015  . Neuropathy 11/16/2015  . Fibromyalgia syndrome 11/16/2015  .  Essential hypertension 06/15/2015  . Hyperlipidemia 06/15/2015  . Chronic pain syndrome 06/15/2015  . Anxiety and depression 06/15/2015  . Memory deficits 08/11/2013  . Myalgia and myositis, unspecified 04/07/2013  . OSA (obstructive sleep apnea) 04/04/2013   Past Medical History:  Diagnosis Date  . Arthritis    neck, central lumbar area  . Cervical spine disease 10/2012  . Chronic pain syndrome   . DDD (degenerative disc disease), lumbar   . Depression   . Dyslipidemia   . Fibroid tumor in breast  . Fibromyalgia   . Headache(784.0)   . Hypertension   . Memory deficits 08/11/2013  . Myalgia and myositis, unspecified 04/07/2013  . Neuropathy   . Obesity   . Osteoarthritis   .  RP (retinitis pigmentosa)   . Vitamin D deficiency     Family History  Problem Relation Age of Onset  . Diabetes Maternal Grandmother   . Alzheimer's disease Maternal Grandmother     Past Surgical History:  Procedure Laterality Date  . TUBAL LIGATION     Social History   Occupational History  . 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 Smoking status: Former Smoker    Packs/day: 0.33    Years: 12.00    Pack years: 3.96    Types: Cigarettes    Quit date: 06/30/2010    Years since quitting: 8.4  . Smokeless tobacco: Never Used  . Tobacco comment: 1pack per 3 days.   Substance and Sexual Activity  . Alcohol use: No    Alcohol/week: 0.0 standard drinks  . Drug use: No  . Sexual activity: Not on file

## 2018-12-10 NOTE — Telephone Encounter (Signed)
Patient call stated she needed an emergency appt. Called patient back no answer. LMOM advised to call us back about getting appt.

## 2019-02-02 ENCOUNTER — Ambulatory Visit (INDEPENDENT_AMBULATORY_CARE_PROVIDER_SITE_OTHER): Payer: Medicaid Other

## 2019-02-02 ENCOUNTER — Ambulatory Visit: Payer: Self-pay

## 2019-02-02 ENCOUNTER — Encounter: Payer: Self-pay | Admitting: Orthopaedic Surgery

## 2019-02-02 ENCOUNTER — Ambulatory Visit (INDEPENDENT_AMBULATORY_CARE_PROVIDER_SITE_OTHER): Payer: Medicaid Other | Admitting: Orthopaedic Surgery

## 2019-02-02 DIAGNOSIS — G8929 Other chronic pain: Secondary | ICD-10-CM

## 2019-02-02 DIAGNOSIS — M25561 Pain in right knee: Secondary | ICD-10-CM

## 2019-02-02 DIAGNOSIS — M25562 Pain in left knee: Secondary | ICD-10-CM

## 2019-02-02 MED ORDER — BUPIVACAINE HCL 0.25 % IJ SOLN
2.0000 mL | INTRAMUSCULAR | Status: AC | PRN
  Administered 2019-02-02: 16:00:00 2 mL via INTRA_ARTICULAR

## 2019-02-02 MED ORDER — LIDOCAINE HCL 1 % IJ SOLN
2.0000 mL | INTRAMUSCULAR | Status: AC | PRN
  Administered 2019-02-02: 2 mL

## 2019-02-02 MED ORDER — METHYLPREDNISOLONE ACETATE 40 MG/ML IJ SUSP
40.0000 mg | INTRAMUSCULAR | Status: AC | PRN
  Administered 2019-02-02: 16:00:00 40 mg via INTRA_ARTICULAR

## 2019-02-02 NOTE — Progress Notes (Signed)
Office Visit Note   Patient: Donna Coleman           Date of Birth: 1971-05-10           MRN: [id number] Visit Date: 02/02/2019              Requested by: Lanae Boast, [place],  [place] [address] PCP: Lanae Boast, FNP   Assessment & Plan: Visit Diagnoses:  1. Chronic pain of both knees     Plan: Impression is end-stage generative joint disease both knees.  Today, we will re-aspirate the right knee but it is too early to inject it.  In regards to the left knee, I will inject this with cortisone.  We have discussed viscosupplementation injection as well as definitive treatment of bilateral sequential total knee replacements.  Handout for both were provided today to the patient.  She will follow-up with [place] as needed.  Call with concerns or questions in the meantime.  Follow-Up Instructions: Return if symptoms worsen or fail to improve.   Orders:  Orders Placed This Encounter  Procedures  . Large Joint Inj: R knee  . Large Joint Inj: L knee  . XR Knee Complete 4 Views Right  . XR Knee 1-2 Views Left   No orders of the defined types were placed in this encounter.     Procedures: Large Joint Inj: R knee on 02/02/2019 4:26 PM Indications: pain Details: 22 G needle, anterolateral approach Medications: 2 mL lidocaine 1 %; 2 mL bupivacaine 0.25 %  Large Joint Inj: L knee on 02/02/2019 4:26 PM Indications: pain Details: 22 G needle, anterolateral approach Medications: 2 mL bupivacaine 0.25 %; 2 mL lidocaine 1 %; 40 mg methylPREDNISolone acetate 40 MG/ML      Clinical Data: No additional findings.   Subjective: Chief Complaint  Patient presents with  . Right Knee - Pain  . Left Knee - Pain    HPI patient is a pleasant 48 year old female who presents our clinic today with recurrent bilateral knee pain right greater than left.  The pain has been ongoing for the past 2 to 3 years and has recently worsened.  Pain she has is to the entire aspect  of both knees.  She describes this as a constant ache worse going up and down stairs as well as with increased activity or driving all day.  She has been taking gabapentin and narcotic pain medication which is no longer helping.  She is recently bought Voltaren gel which gives her minimal relief.  She was seen in our office on 12/10/2018 where the right knee was aspirated and injected with cortisone.  This did not help for very long.  He is also had a left knee injection in the past but is unsure when.  No previous surgical intervention to either knee.  Review of Systems as detailed in HPI.  All others reviewed and are negative.   Objective: Vital Signs: There were no vitals taken for this visit.  Physical Exam well-developed well-nourished female no acute distress.  Alert oriented x3.  Ortho Exam examination of both knees reveals range of motion 0 to 95 degrees.  Moderate patellofemoral crepitus.  Medial and lateral joint line tenderness.  Stable valgus varus stress.  She is neurovascular intact distally.  Specialty Comments:  No specialty comments available.  Imaging: Xr Knee 1-2 Views Left  Result Date: 02/02/2019 X-rays reveal near bone-on-bone medial and patellofemoral compartments  Xr Knee Complete 4 Views  Right  Result Date: 02/02/2019 X-rays reveal near bone-on-bone medial and patellofemoral compartments    PMFS History: Patient Active Problem List   Diagnosis Date Noted  . Chronic pain of right knee 12/29/2016  . Pain of left hip joint 12/29/2016  . Pain of right hip joint 10/30/2016  . Chronic pain of left knee 10/30/2016  . Loss of weight 12/24/2015  . Pain, dental 12/24/2015  . Neuropathy 11/16/2015  . Fibromyalgia syndrome 11/16/2015  . Essential hypertension 06/15/2015  . Hyperlipidemia 06/15/2015  . Chronic pain syndrome 06/15/2015  . Anxiety and depression 06/15/2015  . Memory deficits 08/11/2013  . Myalgia and myositis, unspecified 04/07/2013  . OSA  (obstructive sleep apnea) 04/04/2013   Past Medical History:  Diagnosis Date  . Arthritis    neck, central lumbar area  . Cervical spine disease 10/2012  . Chronic pain syndrome   . DDD (degenerative disc disease), lumbar   . Depression   . Dyslipidemia   . Fibroid tumor in breast  . Fibromyalgia   . Headache(784.0)   . Hypertension   . Memory deficits 08/11/2013  . Myalgia and myositis, unspecified 04/07/2013  . Neuropathy   . Obesity   . Osteoarthritis   . RP (retinitis pigmentosa)   . Vitamin D deficiency     Family History  Problem Relation Age of Onset  . Diabetes Maternal Grandmother   . Alzheimer's disease Maternal Grandmother     Past Surgical History:  Procedure Laterality Date  . TUBAL LIGATION     Social History   Occupational History  . 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 Smoking status: Former Smoker    Packs/day: 0.33    Years: 12.00    Pack years: 3.96    Types: Cigarettes    Quit date: 06/30/2010    Years since quitting: 8.6  . Smokeless tobacco: Never Used  . Tobacco comment: 1pack per 3 days.   Substance and Sexual Activity  . Alcohol use: No    Alcohol/week: 0.0 standard drinks  . Drug use: No  . Sexual activity: Not on file

## 2019-03-09 ENCOUNTER — Encounter (HOSPITAL_COMMUNITY): Payer: Self-pay | Admitting: *Deleted

## 2019-03-09 ENCOUNTER — Encounter (HOSPITAL_COMMUNITY): Payer: Self-pay

## 2019-04-29 ENCOUNTER — Telehealth: Payer: Self-pay

## 2019-04-29 NOTE — Telephone Encounter (Signed)
Pt called and wants to proceed with gel injections of both knees. I advised that I woulld send message to advise. She also requested that her office visit notes for the last year be printed for pick up. This has been done. She would also like an itemized bill for services paid for and advised she could get this from the front desk at anytime.

## 2019-04-29 NOTE — Telephone Encounter (Signed)
Ok to get authorization for gel injections?

## 2019-05-03 ENCOUNTER — Ambulatory Visit: Payer: Medicaid Other | Admitting: Family Medicine

## 2019-06-02 ENCOUNTER — Telehealth: Payer: Self-pay | Admitting: Orthopaedic Surgery

## 2019-06-02 NOTE — Telephone Encounter (Signed)
Patient called advised she would like to proceed with getting the gel injection as soon as possible. The number to contact patient is [phone]

## 2019-06-02 NOTE — Telephone Encounter (Signed)
Please submit for gel injections

## 2019-06-06 NOTE — Telephone Encounter (Signed)
Noted  

## 2019-06-07 ENCOUNTER — Telehealth: Payer: Self-pay

## 2019-06-07 NOTE — Telephone Encounter (Signed)
Mailed [employer] Patient Assistance application for patient to complete and return to the office for Monovisc, bilateral knee injection.  Once completed and returned, application will be faxed to [employer].

## 2019-06-20 ENCOUNTER — Telehealth: Payer: Self-pay

## 2019-06-20 NOTE — Telephone Encounter (Signed)
Talked with patient concerning [employer] application. Faxed completed [employer] application to [employer] at [phone].

## 2019-06-21 ENCOUNTER — Telehealth: Payer: Self-pay

## 2019-06-21 NOTE — Telephone Encounter (Signed)
Received a PRF from [employer] Patient [employer]. Faxed completed PRF form to [employer] at [phone].

## 2019-07-05 ENCOUNTER — Telehealth: Payer: Self-pay | Admitting: Radiology

## 2019-07-05 NOTE — Telephone Encounter (Signed)
Patient left message on triage phone requesting call back about paperwork that was dropped off for knee procedures with [name].  I see where gel injections were requested and paper work has been faxed off.  I did leave voicemail with patient that looks like we are waiting on approval but paperwork has been faxed. Explained April is not in office this afternoon, who handles these authorizations. Please call and update patient on status. Thank you! [phone]

## 2019-07-07 NOTE — Telephone Encounter (Signed)
Talked with patient and advised her that appointment can not be made until we have received the gel injection from [employer].  Advised patient that gel injection should arrive between today, 07/07/2019 and tomorrow, 07/08/2019 and I will call her to schedule an appointment.  Patient voiced that she understands.

## 2019-07-08 ENCOUNTER — Telehealth: Payer: Self-pay

## 2019-07-08 NOTE — Telephone Encounter (Signed)
Talked with patient and advised her that I received the gel injection from [employer] for bilateral knee.  Monovisc, bilateral knee. Purchased through [employer]

## 2019-07-19 ENCOUNTER — Encounter: Payer: Self-pay | Admitting: Orthopaedic Surgery

## 2019-07-19 ENCOUNTER — Other Ambulatory Visit: Payer: Self-pay

## 2019-07-19 ENCOUNTER — Ambulatory Visit (INDEPENDENT_AMBULATORY_CARE_PROVIDER_SITE_OTHER): Payer: Medicaid Other | Admitting: Orthopaedic Surgery

## 2019-07-19 VITALS — Ht 62.0 in | Wt 141.0 lb

## 2019-07-19 DIAGNOSIS — M170 Bilateral primary osteoarthritis of knee: Secondary | ICD-10-CM | POA: Diagnosis not present

## 2019-07-19 MED ORDER — BUPIVACAINE HCL 0.25 % IJ SOLN
0.6600 mL | INTRAMUSCULAR | Status: AC | PRN
  Administered 2019-07-19: .66 mL via INTRA_ARTICULAR

## 2019-07-19 MED ORDER — HYALURONAN 88 MG/4ML IX SOSY
88.0000 mg | PREFILLED_SYRINGE | INTRA_ARTICULAR | Status: AC | PRN
  Administered 2019-07-19: 88 mg via INTRA_ARTICULAR

## 2019-07-19 MED ORDER — LIDOCAINE HCL 1 % IJ SOLN
3.0000 mL | INTRAMUSCULAR | Status: AC | PRN
  Administered 2019-07-19: 11:00:00 3 mL

## 2019-07-19 MED ORDER — LIDOCAINE HCL 1 % IJ SOLN
3.0000 mL | INTRAMUSCULAR | Status: AC | PRN
  Administered 2019-07-19: 3 mL

## 2019-07-19 NOTE — Progress Notes (Signed)
   Procedure Note  Patient: Donna Coleman             Date of Birth: June 11, 1971           MRN: [id number]             Visit Date: 07/19/2019  Procedures: Visit Diagnoses:  1. Bilateral primary osteoarthritis of knee     Large Joint Inj: bilateral knee on 07/19/2019 10:58 AM Indications: pain Details: 22 G needle, anterolateral approach Medications (Right): 0.66 mL bupivacaine 0.25 %; 3 mL lidocaine 1 %; 88 mg Hyaluronan 88 MG/4ML Medications (Left): 0.66 mL bupivacaine 0.25 %; 3 mL lidocaine 1 %; 88 mg Hyaluronan 88 MG/4ML

## 2019-08-25 ENCOUNTER — Encounter: Payer: Self-pay | Admitting: Orthopaedic Surgery

## 2019-08-25 ENCOUNTER — Ambulatory Visit (INDEPENDENT_AMBULATORY_CARE_PROVIDER_SITE_OTHER): Payer: Medicaid Other | Admitting: Orthopaedic Surgery

## 2019-08-25 ENCOUNTER — Other Ambulatory Visit: Payer: Self-pay

## 2019-08-25 ENCOUNTER — Ambulatory Visit (INDEPENDENT_AMBULATORY_CARE_PROVIDER_SITE_OTHER): Payer: Medicaid Other

## 2019-08-25 ENCOUNTER — Ambulatory Visit: Payer: Self-pay

## 2019-08-25 DIAGNOSIS — M170 Bilateral primary osteoarthritis of knee: Secondary | ICD-10-CM | POA: Diagnosis not present

## 2019-08-25 DIAGNOSIS — M7061 Trochanteric bursitis, right hip: Secondary | ICD-10-CM

## 2019-08-25 DIAGNOSIS — M7062 Trochanteric bursitis, left hip: Secondary | ICD-10-CM | POA: Diagnosis not present

## 2019-08-25 MED ORDER — BUPIVACAINE HCL 0.25 % IJ SOLN
0.6600 mL | INTRAMUSCULAR | Status: AC | PRN
  Administered 2019-08-25: 11:00:00 .66 mL via INTRA_ARTICULAR

## 2019-08-25 MED ORDER — METHYLPREDNISOLONE ACETATE 40 MG/ML IJ SUSP
13.3300 mg | INTRAMUSCULAR | Status: AC | PRN
  Administered 2019-08-25: 13.33 mg via INTRA_ARTICULAR

## 2019-08-25 MED ORDER — LIDOCAINE HCL 1 % IJ SOLN
0.5000 mL | INTRAMUSCULAR | Status: AC | PRN
  Administered 2019-08-25: .5 mL

## 2019-08-25 MED ORDER — BUPIVACAINE HCL 0.25 % IJ SOLN
0.6600 mL | INTRAMUSCULAR | Status: AC | PRN
  Administered 2019-08-25: .66 mL via INTRA_ARTICULAR

## 2019-08-25 NOTE — Progress Notes (Signed)
Office Visit Note   Patient: Donna Coleman           Date of Birth: 23-Dec-1970           MRN: [id number] Visit Date: 08/25/2019              Requested by: Lanae Boast, [address] [place],  [place] [address] PCP: Lanae Boast, FNP   Assessment & Plan: Visit Diagnoses:  1. Trochanteric bursitis, left hip   2. Trochanteric bursitis, right hip   3. Bilateral primary osteoarthritis of knee     Plan: Impression is bilateral hip trochanteric bursitis right greater than left and bilateral knee osteoarthritis.  We will inject both trochanteric bursa's with cortisone today.  Have also provided the patient with an iliotibial band stretching program.  In regards to her knees, she would like to put off cortisone injections for now.  She will follow up with [place] as needed.  Follow-Up Instructions: Return if symptoms worsen or fail to improve.   Orders:  Orders Placed This Encounter  Procedures  . Large Joint Inj: bilateral greater trochanter  . XR Knee Complete 4 Views Right  . XR Knee Complete 4 Views Left   No orders of the defined types were placed in this encounter.     Procedures: Large Joint Inj: bilateral greater trochanter on 08/25/2019 11:11 AM Indications: pain Details: 22 G needle, lateral approach Medications (Right): 0.5 mL lidocaine 1 %; 0.66 mL bupivacaine 0.25 %; 13.33 mg methylPREDNISolone acetate 40 MG/ML Medications (Left): 0.5 mL lidocaine 1 %; 0.66 mL bupivacaine 0.25 %; 13.33 mg methylPREDNISolone acetate 40 MG/ML      Clinical Data: No additional findings.   Subjective: Chief Complaint  Patient presents with  . Right Knee - Follow-up  . Left Knee - Follow-up    HPI patient is a pleasant 49 year old female who comes in today with recurrent bilateral lateral hip pain and bilateral knee pain.  She notes a history of trochanteric bursitis several years ago that was injected with cortisone.  She did note significant relief of symptoms until  recently.  The pain has returned and has actually worsened since last week after falling down 3 stairs bumping her right side into the wall.  All the pain is to the lateral hip.  Worse with walking and trying to cross her legs.  No pain to the anterior thigh or groin.  In regards to her knees, she does have a history of bilateral knee osteoarthritis.  She has had both cortisone and viscosupplementation injections.  Her last cortisone injection to the left knee was in August 2020 and right knee cortisone injection was in June 2020.  Bilateral knees were injected with viscosupplementation and January 2021.  She had great relief of symptoms initially following the gel injections.  She notes her symptoms are aggravated after this fall last week.  Review of Systems as detailed in HPI.  All others reviewed and are negative.   Objective: Vital Signs: There were no vitals taken for this visit.  Physical Exam well-developed well-nourished female no acute distress.  Alert and oriented x3.  Ortho Exam examination of both hips reveals negative logroll.  Negative FADIR.  She does have moderate tenderness to the greater trochanter right greater than left.  Negative straight leg raise.  Range of motion of both knees from 0 to 115 degrees.  No joint line tenderness.  Mild patellofemoral crepitus.  Ligaments are stable.  She is neurovascular intact  distally.  Specialty Comments:  No specialty comments available.  Imaging: XR Knee Complete 4 Views Left  Result Date: 08/25/2019 Moderate medial and patellofemoral compartment degenerative changes with tricompartmental osteophytes   XR Knee Complete 4 Views Right  Result Date: 08/25/2019 Moderate medial and patellofemoral compartment degenerative changes with tricompartmental osteophytes     PMFS History: Patient Active Problem List   Diagnosis Date Noted  . Chronic pain of right knee 12/29/2016  . Pain of left hip joint 12/29/2016  . Pain of right hip  joint 10/30/2016  . Chronic pain of left knee 10/30/2016  . Loss of weight 12/24/2015  . Pain, dental 12/24/2015  . Neuropathy 11/16/2015  . Fibromyalgia syndrome 11/16/2015  . Essential hypertension 06/15/2015  . Hyperlipidemia 06/15/2015  . Chronic pain syndrome 06/15/2015  . Anxiety and depression 06/15/2015  . Memory deficits 08/11/2013  . Myalgia and myositis, unspecified 04/07/2013  . OSA (obstructive sleep apnea) 04/04/2013   Past Medical History:  Diagnosis Date  . Arthritis    neck, central lumbar area  . Cervical spine disease 10/2012  . Chronic pain syndrome   . DDD (degenerative disc disease), lumbar   . Depression   . Dyslipidemia   . Fibroid tumor in breast  . Fibromyalgia   . Headache(784.0)   . Hypertension   . Memory deficits 08/11/2013  . Myalgia and myositis, unspecified 04/07/2013  . Neuropathy   . Obesity   . Osteoarthritis   . RP (retinitis pigmentosa)   . Vitamin D deficiency     Family History  Problem Relation Age of Onset  . Diabetes Maternal Grandmother   . Alzheimer's disease Maternal Grandmother     Past Surgical History:  Procedure Laterality Date  . TUBAL LIGATION     Social History   Occupational History  . 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 Smoking status: Former Smoker    Packs/day: 0.33    Years: 12.00    Pack years: 3.96    Types: Cigarettes    Quit date: 06/30/2010    Years since quitting: 9.1  . Smokeless tobacco: Never Used  . Tobacco comment: 1pack per 3 days.   Substance and Sexual Activity  . Alcohol use: No    Alcohol/week: 0.0 standard drinks  . Drug use: No  . Sexual activity: Not on file

## 2019-08-29 ENCOUNTER — Ambulatory Visit: Payer: Medicaid Other | Admitting: Orthopaedic Surgery

## 2020-01-30 ENCOUNTER — Other Ambulatory Visit: Payer: Self-pay | Admitting: Endocrinology

## 2020-01-30 DIAGNOSIS — Z1231 Encounter for screening mammogram for malignant neoplasm of breast: Secondary | ICD-10-CM

## 2020-02-27 ENCOUNTER — Ambulatory Visit: Payer: Medicaid Other

## 2020-03-30 DEATH — deceased
